# Patient Record
Sex: Female | Born: 1985 | Race: Black or African American | Hispanic: No | Marital: Single | Smoking: Current some day smoker
Health system: Southern US, Community
[De-identification: ages and names within clinical notes are randomized; demographics above are authoritative.]

## PROBLEM LIST (undated history)

## (undated) HISTORY — PX: SALPINGECTOMY: SHX328

---
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
## 2004-10-04 ENCOUNTER — Emergency Department (HOSPITAL_COMMUNITY): Admission: EM | Admit: 2004-10-04 | Discharge: 2004-10-04 | Payer: Self-pay | Admitting: *Deleted

## 2004-10-06 ENCOUNTER — Inpatient Hospital Stay (HOSPITAL_COMMUNITY): Admission: AD | Admit: 2004-10-06 | Discharge: 2004-10-06 | Payer: Self-pay | Admitting: Obstetrics and Gynecology

## 2004-10-09 ENCOUNTER — Inpatient Hospital Stay (HOSPITAL_COMMUNITY): Admission: AD | Admit: 2004-10-09 | Discharge: 2004-10-09 | Payer: Self-pay | Admitting: Obstetrics and Gynecology

## 2004-10-12 ENCOUNTER — Inpatient Hospital Stay (HOSPITAL_COMMUNITY): Admission: RE | Admit: 2004-10-12 | Discharge: 2004-10-12 | Payer: Self-pay | Admitting: Obstetrics and Gynecology

## 2004-10-15 ENCOUNTER — Inpatient Hospital Stay (HOSPITAL_COMMUNITY): Admission: AD | Admit: 2004-10-15 | Discharge: 2004-10-15 | Payer: Self-pay | Admitting: Obstetrics and Gynecology

## 2004-10-18 ENCOUNTER — Inpatient Hospital Stay (HOSPITAL_COMMUNITY): Admission: AD | Admit: 2004-10-18 | Discharge: 2004-10-18 | Payer: Self-pay | Admitting: Obstetrics and Gynecology

## 2004-10-25 ENCOUNTER — Inpatient Hospital Stay (HOSPITAL_COMMUNITY): Admission: AD | Admit: 2004-10-25 | Discharge: 2004-10-25 | Payer: Self-pay | Admitting: Obstetrics and Gynecology

## 2004-10-27 ENCOUNTER — Encounter (INDEPENDENT_AMBULATORY_CARE_PROVIDER_SITE_OTHER): Payer: Self-pay | Admitting: *Deleted

## 2004-10-27 ENCOUNTER — Ambulatory Visit: Admission: AD | Admit: 2004-10-27 | Discharge: 2004-10-27 | Payer: Self-pay | Admitting: Obstetrics and Gynecology

## 2004-10-29 ENCOUNTER — Inpatient Hospital Stay (HOSPITAL_COMMUNITY): Admission: AD | Admit: 2004-10-29 | Discharge: 2004-10-29 | Payer: Self-pay | Admitting: Obstetrics & Gynecology

## 2004-11-29 ENCOUNTER — Emergency Department (HOSPITAL_COMMUNITY): Admission: EM | Admit: 2004-11-29 | Discharge: 2004-11-30 | Payer: Self-pay | Admitting: Emergency Medicine

## 2005-06-06 ENCOUNTER — Emergency Department (HOSPITAL_COMMUNITY): Admission: EM | Admit: 2005-06-06 | Discharge: 2005-06-06 | Payer: Self-pay | Admitting: *Deleted

## 2005-06-08 ENCOUNTER — Emergency Department (HOSPITAL_COMMUNITY): Admission: EM | Admit: 2005-06-08 | Discharge: 2005-06-08 | Payer: Self-pay | Admitting: *Deleted

## 2005-06-14 ENCOUNTER — Emergency Department (HOSPITAL_COMMUNITY): Admission: EM | Admit: 2005-06-14 | Discharge: 2005-06-14 | Payer: Self-pay | Admitting: Emergency Medicine

## 2005-06-16 ENCOUNTER — Emergency Department (HOSPITAL_COMMUNITY): Admission: EM | Admit: 2005-06-16 | Discharge: 2005-06-16 | Payer: Self-pay | Admitting: Emergency Medicine

## 2006-01-10 ENCOUNTER — Inpatient Hospital Stay (HOSPITAL_COMMUNITY): Admission: AD | Admit: 2006-01-10 | Discharge: 2006-01-10 | Payer: Self-pay | Admitting: Gynecology

## 2006-08-14 IMAGING — US US TRANSVAGINAL NON-OB
1 series · 13 of 25 positions shown · non-contrast
Comparison: none

CLINICAL DATA: Early pregnancy.  Approximately eight weeks gestational age by LMP.  Pelvic pain.  Quantitative beta-HCG level of [DATE].  
 OBSTETRICAL ULTRASOUND WITH TRANSVAGINAL:
 There is no evidence of an intrauterine gestational sac.  No other fluid collections are seen within the endometrial cavity. There is no evidence of fibroids or other uterine abnormalities. 
 The left ovary contains a small rounded hypoechoic area measuring approximately 2 x 1.2 cm which has sonographic features of a hemorrhagic corpus luteum.  There is no evidence of free fluid in the left adnexa. 
 The right ovary is normal in appearance.  No right ovarian or adnexal masses are seen, however there is a small amount of free fluid seen in the right adnexa.

[Series 1: ob · 0.27mm/px · 13 of 36 slices shown]
[im 1/36]
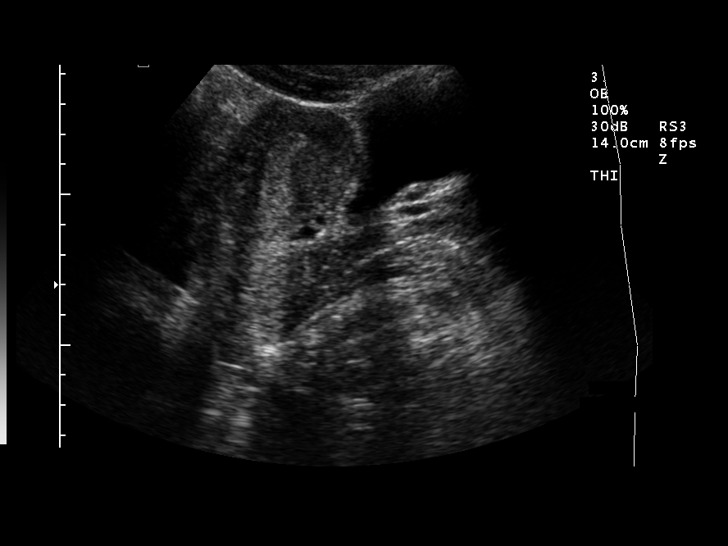
[im 3/36]
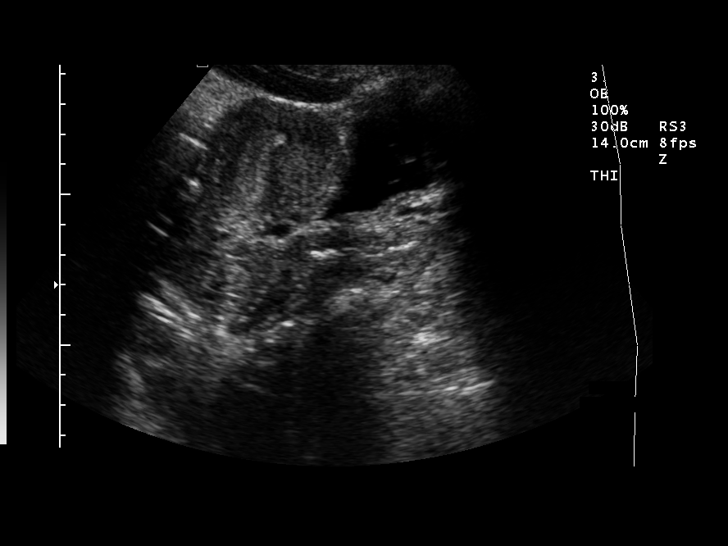
[im 6/36]
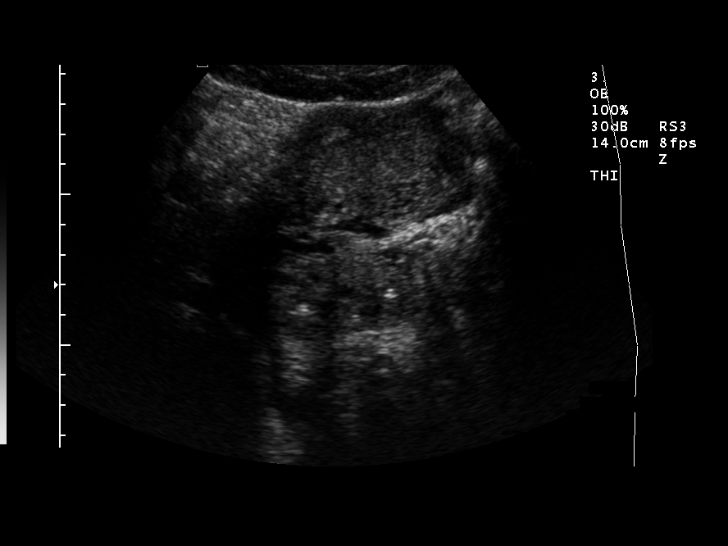
[im 9/36]
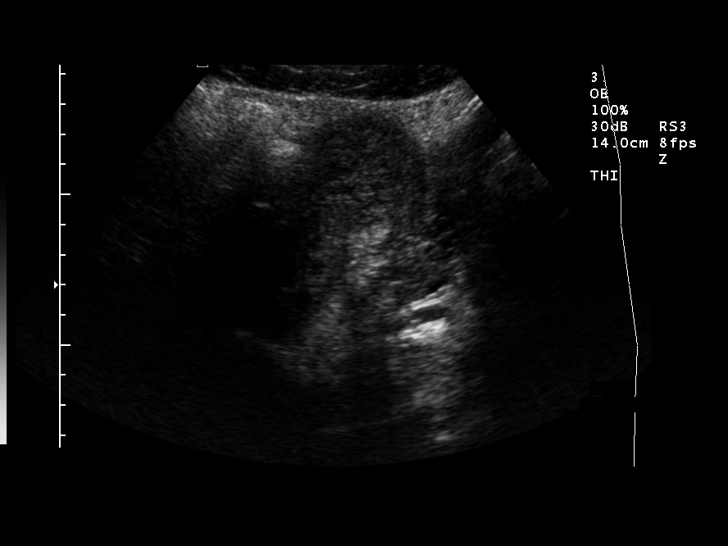
[im 12/36]
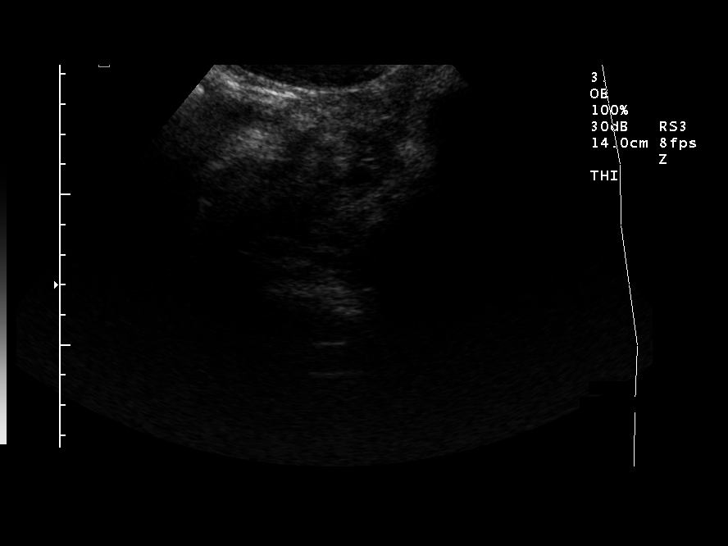
[im 15/36]
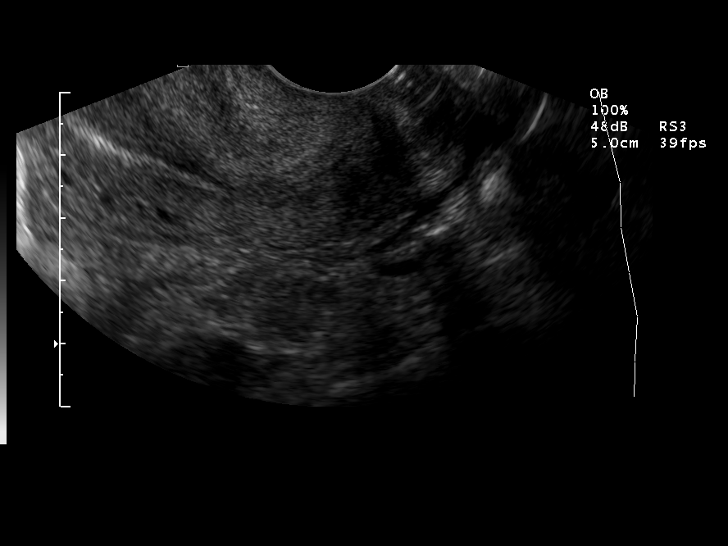
[im 18/36]
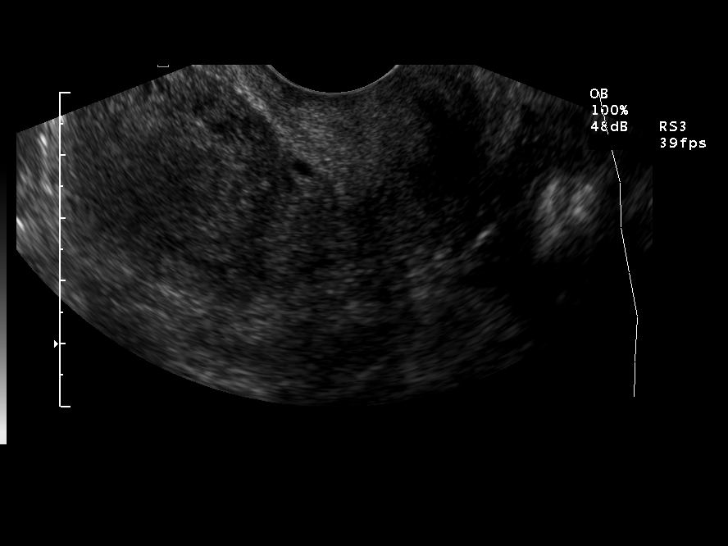
[im 21/36]
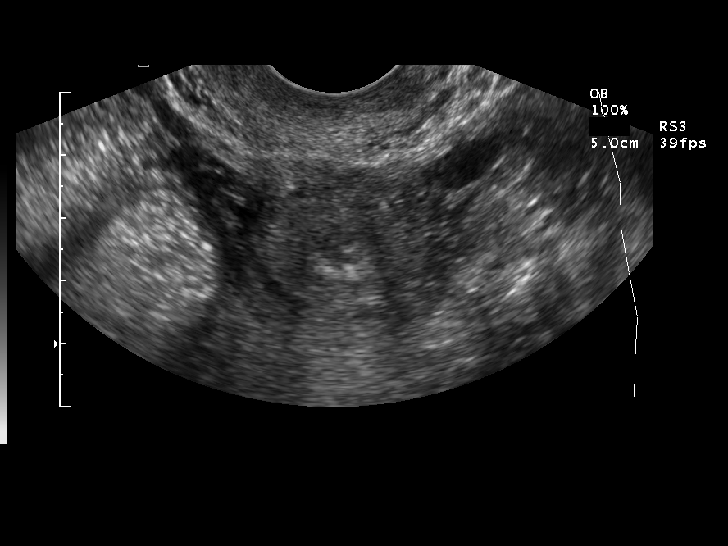
[im 24/36]
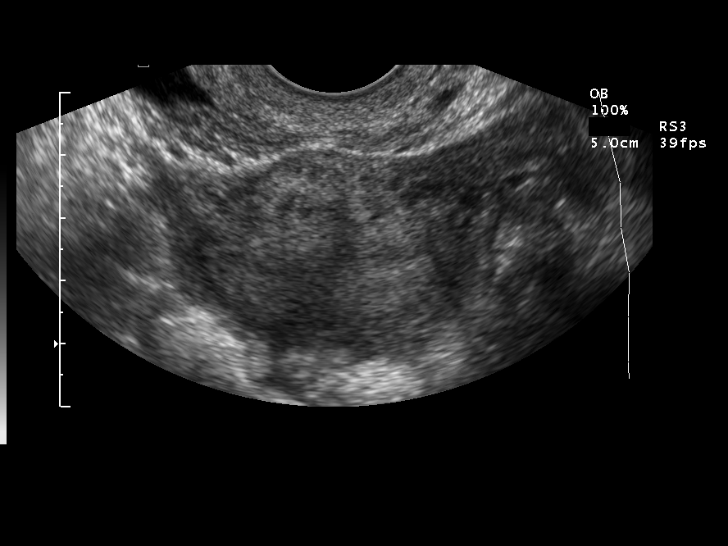
[im 27/36]
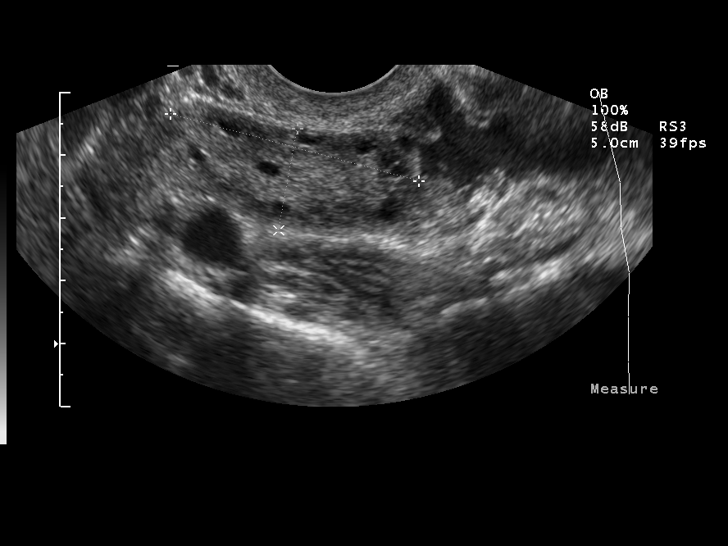
[im 30/36]
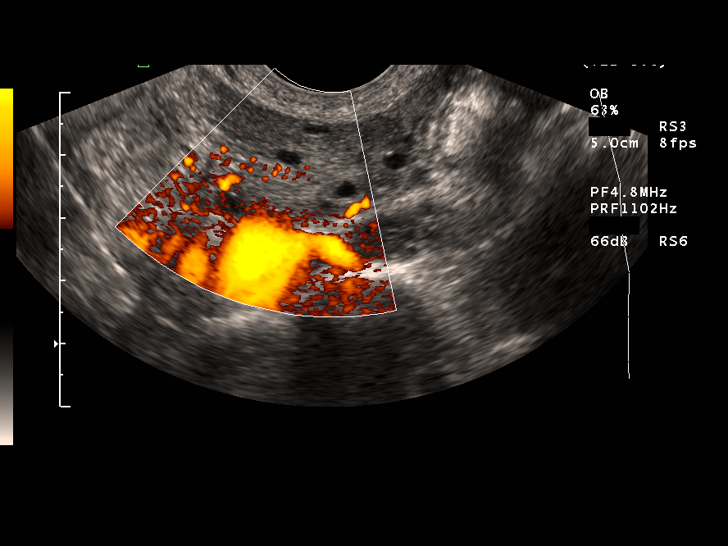
[im 33/36]
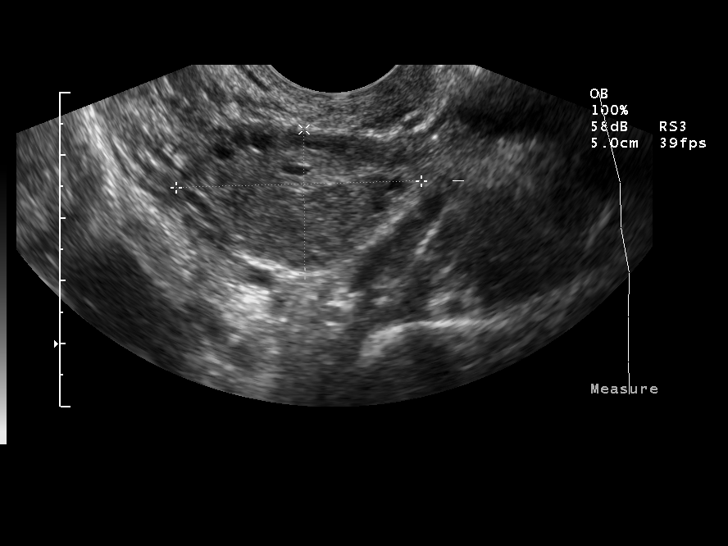
[im 36/36]
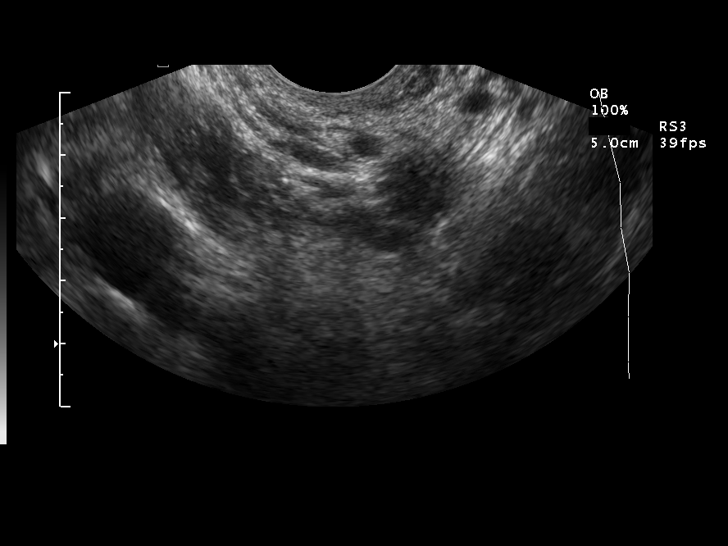

[13 of 25 positions shown; findings below may reference images not displayed]

IMPRESSION: No sonographic evidence of intrauterine gestational sac.  A small amount of free fluid is seen in the right adnexa, but no definite adnexal mass identified.  Although the beta-HCG level is greater than the typical discriminatory level to visualize an intrauterine gestational sac, an early IUP is still a possibility, however, an ectopic pregnancy cannot be excluded.  Close followup of quantitative beta-HCG levels is recommended as well as ultrasound followup.

## 2006-08-16 IMAGING — US US OB TRANSVAGINAL
1 series · 13 of 28 positions shown · non-contrast
Comparison: none

CLINICAL DATA: Abdominal pain with quantitative beta hCG of 6819.
TRANSVAGINAL OBSTETRICAL ULTRASOUND:
Correlation is made with the previous exam on 10/04/04 from [HOSPITAL].
Multiple images of the uterus and adnexa were obtained using an endovaginal approach.
There is a thickened endometrial canal identified measuring 1.0 cm.  No evidence for an intrauterine pregnancy is noted and should be clearly delineated with a quantitative beta hCG of 6819.  
Scanning in the left adnexa reveals a normal appearance to the left ovary which measures 3.2 x 2.2 x 2.0 cm.  Directly medial to the left ovary is an echogenic ring lesion measuring 1.6 x 1.1 x 1.3 cm.  This demonstrates peripheral flow and a 4 mm central lucency.  No evidence for a yolk sac is seen within this, but this would correlate with an approximate 4.5 week gestational sac, and a yolk sac would not necessarily be expected.  Given the lack of an intrauterine pregnancy this finding is highly suspicious for a left ectopic pregnancy.
The right ovary has a normal appearance measuring 1.8 x 4.1 x 1.7 cm.  There is some free fluid adjacent to the right ovary which as a proteinaceous component and ungulating septations within it suggesting the presence of adhesions.  No pelvic fluid is seen and a small amount of cul-de-sac fluid is noted.

[Series 1: us ob transvaginal · 0.19mm/px · 13 of 41 slices shown]
[im 2/41]
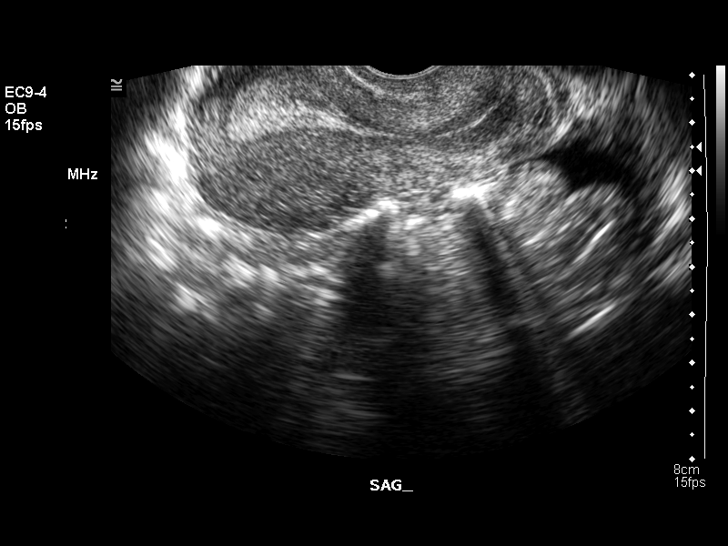
[im 5/41]
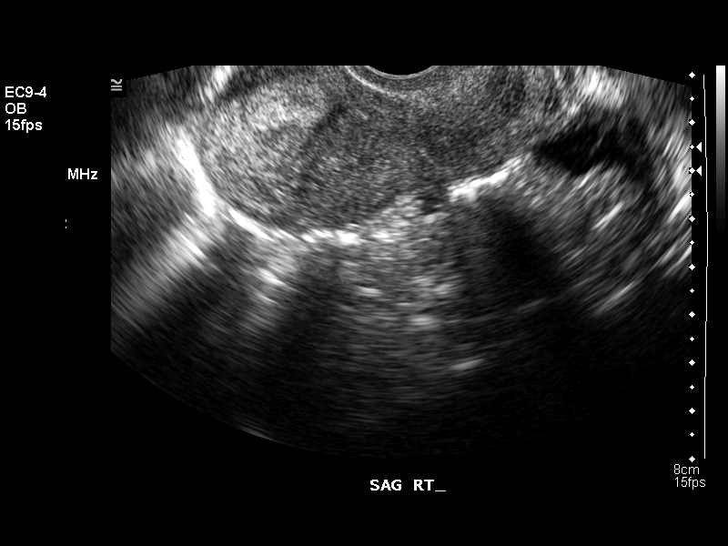
[im 8/41]
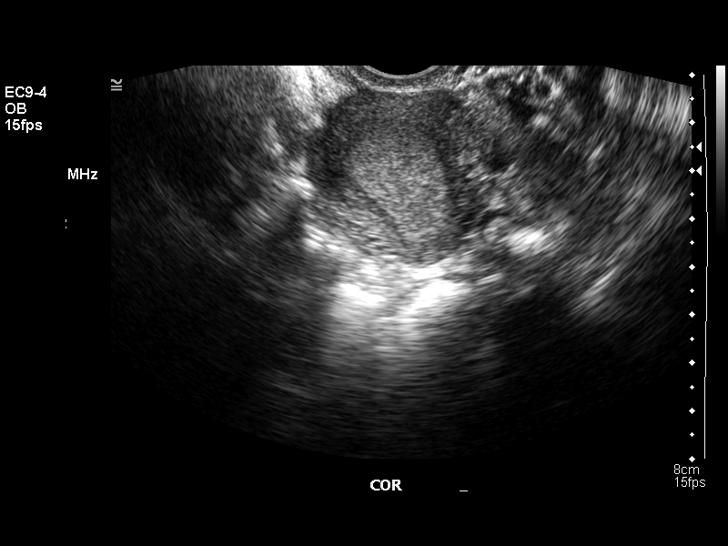
[im 11/41]
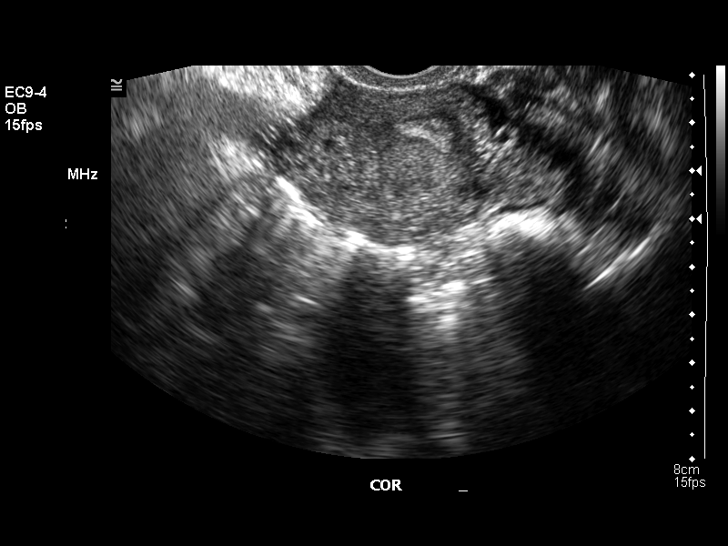
[im 14/41]
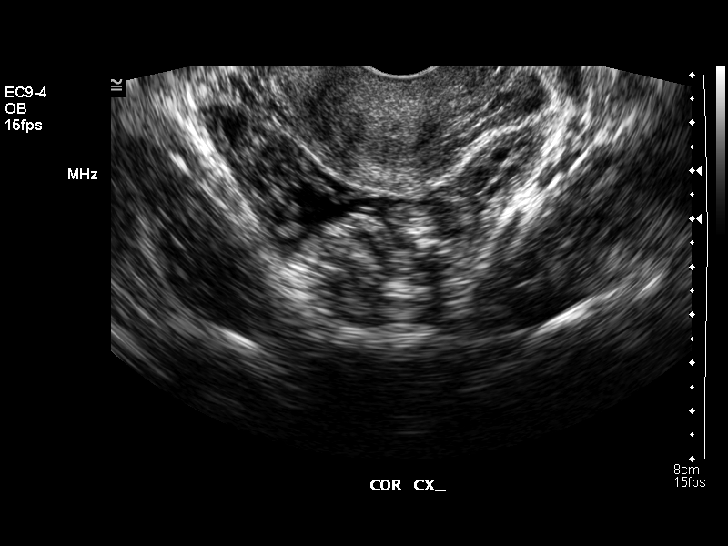
[im 17/41]
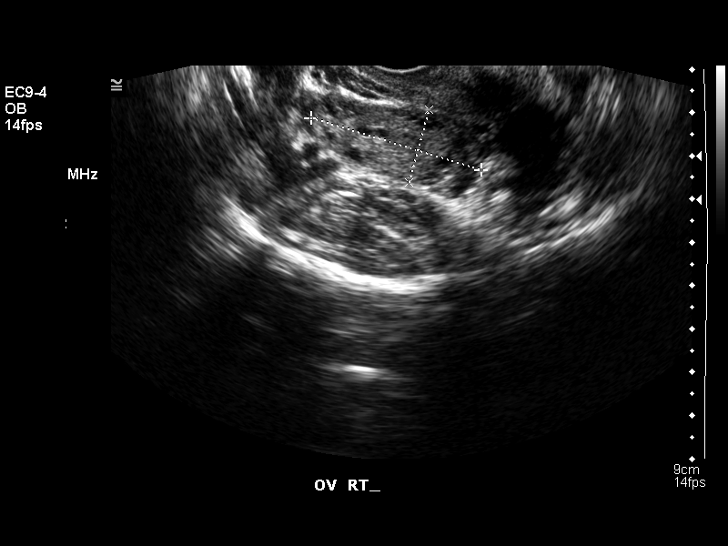
[im 21/41]
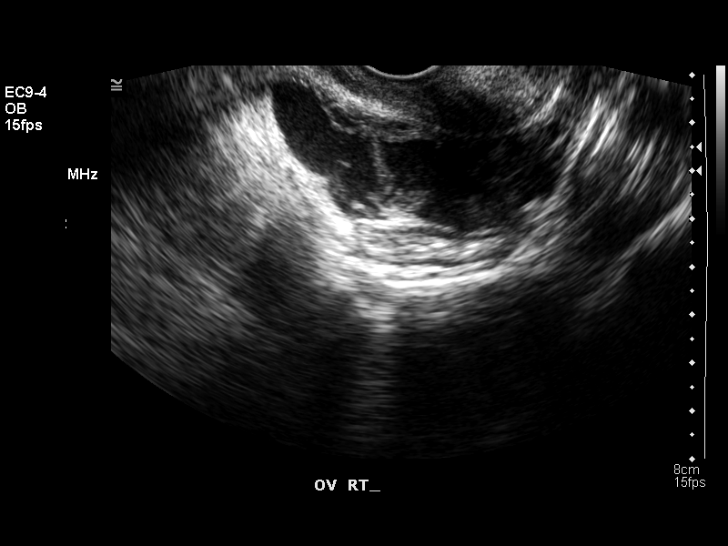
[im 24/41]
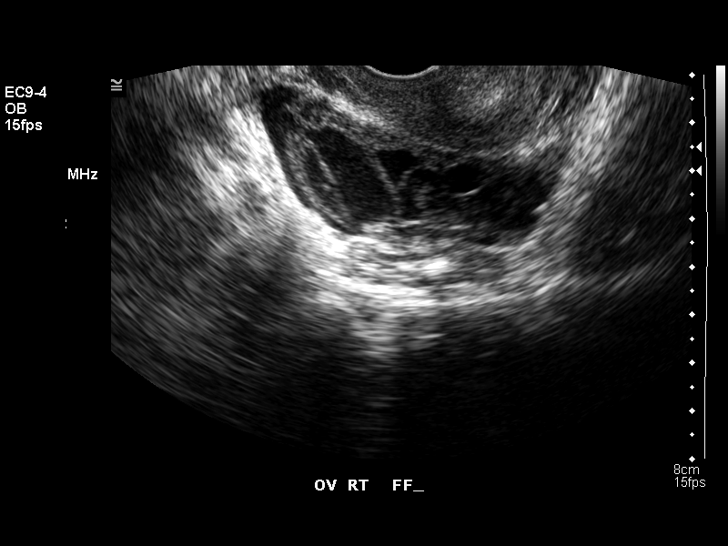
[im 27/41]
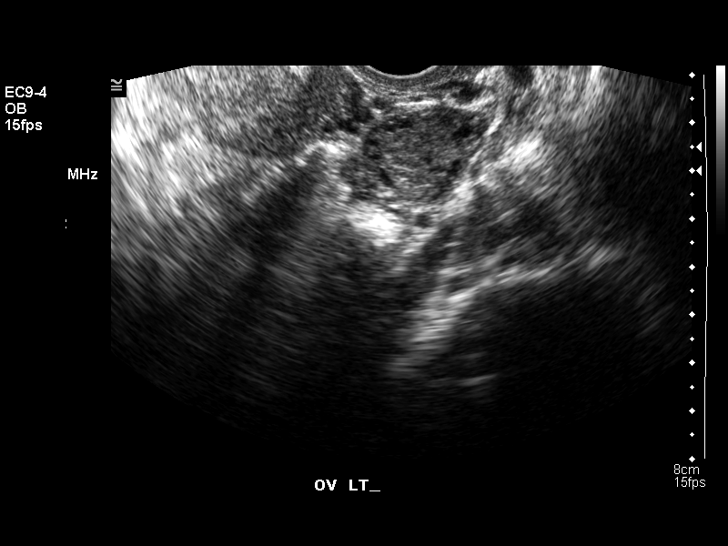
[im 30/41]
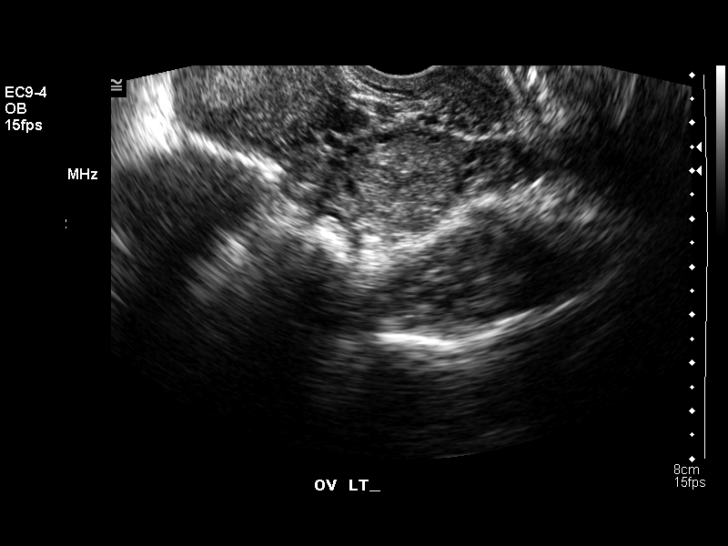
[im 33/41]
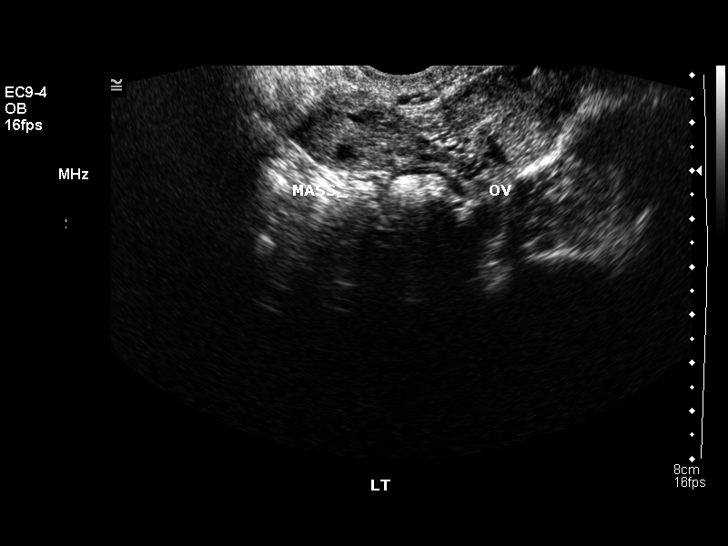
[im 36/41]
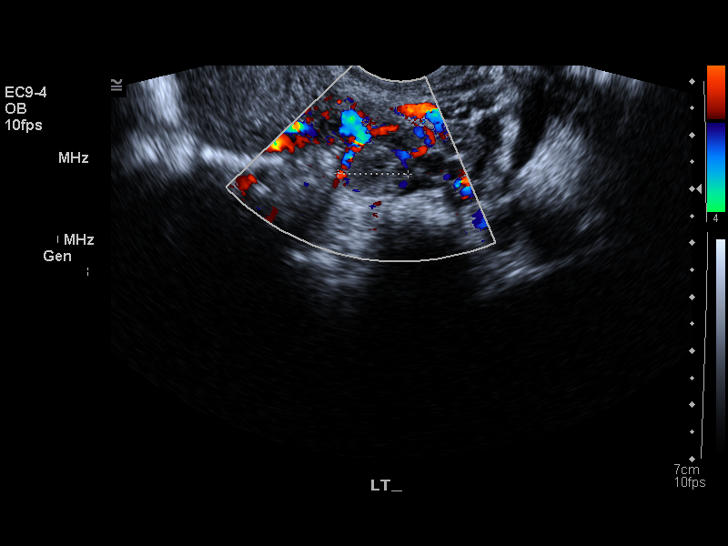
[im 39/41]
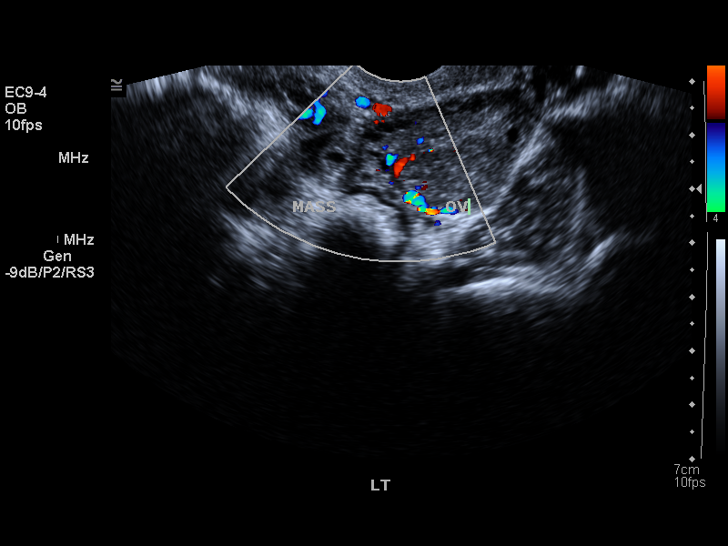

[13 of 28 positions shown; findings below may reference images not displayed]

IMPRESSION: Findings highly suspicious for a left ectopic pregnancy as described above.  Findings also suggestive of right paraovarian adhesions.

## 2009-01-10 ENCOUNTER — Emergency Department (HOSPITAL_COMMUNITY): Admission: EM | Admit: 2009-01-10 | Discharge: 2009-01-10 | Payer: Self-pay | Admitting: Emergency Medicine

## 2010-02-02 ENCOUNTER — Emergency Department (HOSPITAL_COMMUNITY): Admission: EM | Admit: 2010-02-02 | Discharge: 2010-02-02 | Payer: Self-pay | Admitting: Emergency Medicine

## 2010-02-04 ENCOUNTER — Ambulatory Visit: Payer: Self-pay | Admitting: Obstetrics and Gynecology

## 2010-02-04 ENCOUNTER — Inpatient Hospital Stay (HOSPITAL_COMMUNITY): Admission: AD | Admit: 2010-02-04 | Discharge: 2010-02-05 | Payer: Self-pay | Admitting: Obstetrics and Gynecology

## 2010-02-07 ENCOUNTER — Ambulatory Visit (HOSPITAL_COMMUNITY): Admission: RE | Admit: 2010-02-07 | Discharge: 2010-02-07 | Payer: Self-pay | Admitting: Obstetrics & Gynecology

## 2010-02-10 ENCOUNTER — Inpatient Hospital Stay (HOSPITAL_COMMUNITY): Admission: AD | Admit: 2010-02-10 | Discharge: 2010-02-10 | Payer: Self-pay | Admitting: Family Medicine

## 2010-02-13 ENCOUNTER — Ambulatory Visit (HOSPITAL_COMMUNITY): Admission: RE | Admit: 2010-02-13 | Discharge: 2010-02-13 | Payer: Self-pay | Admitting: Obstetrics & Gynecology

## 2010-02-16 ENCOUNTER — Ambulatory Visit: Payer: Self-pay | Admitting: Obstetrics and Gynecology

## 2010-02-16 ENCOUNTER — Ambulatory Visit (HOSPITAL_COMMUNITY): Admission: RE | Admit: 2010-02-16 | Discharge: 2010-02-16 | Payer: Self-pay | Admitting: Family Medicine

## 2010-02-17 ENCOUNTER — Inpatient Hospital Stay (HOSPITAL_COMMUNITY): Admission: AD | Admit: 2010-02-17 | Discharge: 2010-02-17 | Payer: Self-pay | Admitting: Family Medicine

## 2010-02-23 ENCOUNTER — Ambulatory Visit: Payer: Self-pay | Admitting: Obstetrics and Gynecology

## 2010-02-24 ENCOUNTER — Ambulatory Visit (HOSPITAL_COMMUNITY): Admission: RE | Admit: 2010-02-24 | Discharge: 2010-02-24 | Payer: Self-pay | Admitting: Family Medicine

## 2010-02-24 ENCOUNTER — Ambulatory Visit: Payer: Self-pay | Admitting: Obstetrics & Gynecology

## 2010-02-25 ENCOUNTER — Encounter: Payer: Self-pay | Admitting: Obstetrics & Gynecology

## 2010-03-03 ENCOUNTER — Ambulatory Visit: Payer: Self-pay | Admitting: Family Medicine

## 2010-03-03 ENCOUNTER — Encounter (INDEPENDENT_AMBULATORY_CARE_PROVIDER_SITE_OTHER): Payer: Self-pay | Admitting: *Deleted

## 2010-03-10 ENCOUNTER — Encounter: Payer: Self-pay | Admitting: Obstetrics and Gynecology

## 2010-03-10 ENCOUNTER — Ambulatory Visit: Payer: Self-pay | Admitting: Obstetrics & Gynecology

## 2010-04-21 ENCOUNTER — Encounter (INDEPENDENT_AMBULATORY_CARE_PROVIDER_SITE_OTHER): Payer: Self-pay | Admitting: Family Medicine

## 2010-04-21 ENCOUNTER — Ambulatory Visit: Payer: Self-pay | Admitting: Obstetrics and Gynecology

## 2010-04-21 LAB — CONVERTED CEMR LAB: hCG, Beta Chain, Quant, S: <2 milliintl units/mL

## 2010-04-22 ENCOUNTER — Encounter (INDEPENDENT_AMBULATORY_CARE_PROVIDER_SITE_OTHER): Payer: Self-pay | Admitting: Family Medicine

## 2010-04-22 LAB — CONVERTED CEMR LAB
Clue Cells Wet Prep HPF POC: NONE SEEN
Yeast Wet Prep HPF POC: NONE SEEN

## 2010-05-04 ENCOUNTER — Ambulatory Visit: Payer: Self-pay | Admitting: Obstetrics and Gynecology

## 2010-05-04 ENCOUNTER — Inpatient Hospital Stay (HOSPITAL_COMMUNITY): Admission: AD | Admit: 2010-05-04 | Discharge: 2010-05-04 | Payer: Self-pay | Admitting: Obstetrics and Gynecology

## 2010-05-09 ENCOUNTER — Ambulatory Visit: Payer: Self-pay | Admitting: Obstetrics & Gynecology

## 2010-07-29 ENCOUNTER — Ambulatory Visit: Payer: Self-pay | Admitting: Obstetrics & Gynecology

## 2010-10-14 ENCOUNTER — Ambulatory Visit: Admit: 2010-10-14 | Payer: Self-pay | Admitting: Obstetrics & Gynecology

## 2010-10-16 ENCOUNTER — Encounter: Payer: Self-pay | Admitting: *Deleted

## 2010-12-09 LAB — URINALYSIS, ROUTINE W REFLEX MICROSCOPIC
Glucose, UA: NEGATIVE mg/dL
Ketones, ur: 15 mg/dL — AB
Protein, ur: NEGATIVE mg/dL

## 2010-12-09 LAB — CBC
MCH: 33 pg (ref 26.0–34.0)
MCHC: 34.6 g/dL (ref 30.0–36.0)
MCV: 95.6 fL (ref 78.0–100.0)
Platelets: 186 10*3/uL (ref 150–400)
RDW: 12.8 % (ref 11.5–15.5)
WBC: 8.1 10*3/uL (ref 4.0–10.5)

## 2010-12-09 LAB — URINE MICROSCOPIC-ADD ON

## 2010-12-09 LAB — URINE CULTURE: Culture  Setup Time: 201108110058

## 2010-12-09 LAB — WET PREP, GENITAL
Trich, Wet Prep: NONE SEEN
Yeast Wet Prep HPF POC: NONE SEEN

## 2010-12-09 LAB — POCT PREGNANCY, URINE: Preg Test, Ur: NEGATIVE

## 2010-12-09 LAB — GC/CHLAMYDIA PROBE AMP, GENITAL: Chlamydia, DNA Probe: NEGATIVE

## 2010-12-12 LAB — DIFFERENTIAL
Basophils Relative: 0 % (ref 0–1)
Eosinophils Absolute: 0 10*3/uL (ref 0.0–0.7)
Monocytes Absolute: 0 10*3/uL — ABNORMAL LOW (ref 0.1–1.0)
Monocytes Relative: 0 % — ABNORMAL LOW (ref 3–12)

## 2010-12-12 LAB — CBC
HCT: 37.9 % (ref 36.0–46.0)
Hemoglobin: 12.1 g/dL (ref 12.0–15.0)
Hemoglobin: 13.3 g/dL (ref 12.0–15.0)
MCHC: 34.8 g/dL (ref 30.0–36.0)
MCHC: 35 g/dL (ref 30.0–36.0)
MCV: 96.6 fL (ref 78.0–100.0)
MCV: 96.8 fL (ref 78.0–100.0)
RBC: 3.59 MIL/uL — ABNORMAL LOW (ref 3.87–5.11)
RDW: 13.9 % (ref 11.5–15.5)

## 2010-12-13 LAB — DIFFERENTIAL
Basophils Absolute: 0.1 10*3/uL (ref 0.0–0.1)
Basophils Relative: 0 % (ref 0–1)
Eosinophils Absolute: 0 10*3/uL (ref 0.0–0.7)
Eosinophils Absolute: 0.1 10*3/uL (ref 0.0–0.7)
Eosinophils Relative: 0 % (ref 0–5)
Eosinophils Relative: 1 % (ref 0–5)
Monocytes Absolute: 0.6 10*3/uL (ref 0.1–1.0)
Monocytes Absolute: 0.7 10*3/uL (ref 0.1–1.0)
Monocytes Relative: 5 % (ref 3–12)
Neutrophils Relative %: 74 % (ref 43–77)

## 2010-12-13 LAB — URINE MICROSCOPIC-ADD ON

## 2010-12-13 LAB — CBC
HCT: 40 % (ref 36.0–46.0)
MCHC: 33.6 g/dL (ref 30.0–36.0)
MCHC: 34.4 g/dL (ref 30.0–36.0)
MCV: 95.8 fL (ref 78.0–100.0)
Platelets: 195 10*3/uL (ref 150–400)
RBC: 4.17 MIL/uL (ref 3.87–5.11)
RDW: 13.8 % (ref 11.5–15.5)

## 2010-12-13 LAB — URINALYSIS, ROUTINE W REFLEX MICROSCOPIC
Glucose, UA: NEGATIVE mg/dL
Hgb urine dipstick: NEGATIVE
Ketones, ur: >80 mg/dL — AB
Protein, ur: 30 mg/dL — AB

## 2010-12-13 LAB — WET PREP, GENITAL: Yeast Wet Prep HPF POC: NONE SEEN

## 2010-12-13 LAB — ABO/RH: ABO/RH(D): O POS

## 2010-12-13 LAB — HCG, QUANTITATIVE, PREGNANCY
hCG, Beta Chain, Quant, S: 2006 m[IU]/mL — ABNORMAL HIGH (ref <5)
hCG, Beta Chain, Quant, S: 2542 m[IU]/mL — ABNORMAL HIGH (ref <5)

## 2010-12-13 LAB — BUN: BUN: 8 mg/dL (ref 6–23)

## 2010-12-13 LAB — CREATININE, SERUM
Creatinine, Ser: 0.64 mg/dL (ref 0.4–1.2)
GFR calc non Af Amer: >60 mL/min (ref >60)

## 2010-12-13 LAB — POCT I-STAT, CHEM 8
BUN: 9 mg/dL (ref 6–23)
Chloride: 105 mEq/L (ref 96–112)
HCT: 43 % (ref 36.0–46.0)
Potassium: 3.5 mEq/L (ref 3.5–5.1)
Sodium: 138 mEq/L (ref 135–145)

## 2010-12-13 LAB — GC/CHLAMYDIA PROBE AMP, GENITAL
Chlamydia, DNA Probe: NEGATIVE
GC Probe Amp, Genital: NEGATIVE

## 2010-12-13 LAB — PROTIME-INR: INR: 1.1 (ref 0.00–1.49)

## 2010-12-13 LAB — AST: AST: 24 U/L (ref 0–37)

## 2010-12-23 ENCOUNTER — Inpatient Hospital Stay (HOSPITAL_COMMUNITY)
Admission: AD | Admit: 2010-12-23 | Discharge: 2010-12-23 | Disposition: A | Discharge status: Home / Self Care | Payer: BC Managed Care – PPO | Source: Ambulatory Visit | Attending: Obstetrics & Gynecology | Admitting: Obstetrics & Gynecology

## 2010-12-23 DIAGNOSIS — N949 Unspecified condition associated with female genital organs and menstrual cycle: Secondary | ICD-10-CM | POA: Insufficient documentation

## 2010-12-23 DIAGNOSIS — N938 Other specified abnormal uterine and vaginal bleeding: Secondary | ICD-10-CM | POA: Insufficient documentation

## 2010-12-23 LAB — URINALYSIS, ROUTINE W REFLEX MICROSCOPIC
Bilirubin Urine: NEGATIVE
Glucose, UA: NEGATIVE mg/dL
Protein, ur: NEGATIVE mg/dL
Urobilinogen, UA: 0.2 mg/dL (ref 0.0–1.0)

## 2010-12-23 LAB — URINE MICROSCOPIC-ADD ON

## 2010-12-23 LAB — CBC
HCT: 38.5 % (ref 36.0–46.0)
Hemoglobin: 13.4 g/dL (ref 12.0–15.0)
MCHC: 34.8 g/dL (ref 30.0–36.0)
MCV: 90 fL (ref 78.0–100.0)
RDW: 13.2 % (ref 11.5–15.5)

## 2010-12-23 LAB — WET PREP, GENITAL
Clue Cells Wet Prep HPF POC: NONE SEEN
Trich, Wet Prep: NONE SEEN

## 2011-01-04 LAB — RAPID STREP SCREEN (MED CTR MEBANE ONLY): Streptococcus, Group A Screen (Direct): NEGATIVE

## 2011-02-10 NOTE — Op Note (Signed)
Cathy Sweeney, Cathy Sweeney              ACCOUNT NO.:  [id number]   MEDICAL RECORD NO.:  [id number]          PATIENT TYPE:  AMB   LOCATION:  [facility]                          FACILITY:  [facility]   PHYSICIAN:  Richardean Sale, M.D.   DATE OF BIRTH:  06-21-1986   DATE OF PROCEDURE:  10/27/2004  DATE OF DISCHARGE:                                 OPERATIVE REPORT   PREOPERATIVE DIAGNOSIS:  Persistent ectopic, status post methotrexate x2.   POSTOPERATIVE DIAGNOSIS:  1.  Persistent ectopic, status post methotrexate x2.  2.  Ruptured ectopic.  3.  Extensive PID with Fitz-Hugh-Curtis syndrome.   SURGEON:  Richardean Sale, M.D.   ASSISTANT:  [name], M.D.   PROCEDURE:  Laparotomy with attempted left salpingostomy converted to left  complete salpingectomy for hemorrhagic ruptured ectopic and lysis of  adhesions.   ANESTHESIA:  General.   COMPLICATIONS:  None.   ESTIMATED BLOOD LOSS:  300 mL.   FINDINGS:  Approximately 200 mL of blood in the pelvis coming from the end  of the left fallopian tube and a rent in the end of the left fallopian tube.  Left ectopic pregnancy with the left tube markedly dilated along its entire  length.  Extensive adhesions involving the pelvis, both tubes and ovaries,  and the liver consistent with Fitz-Hugh-Curtis syndrome.  Normal appearing  uterus.   SPECIMENS:  Left fallopian tube and ectopic sent to pathology.   INDICATIONS FOR PROCEDURE:  This is a 25 year old gravida 4, para 0-0-3-0,  black female who was diagnosed with an ectopic pregnancy based on  inappropriately rising quantitative hCG's and an ectopic visualized on  ultrasound.  The patient received her second injection of methotrexate on  October 12, 2004, when her quantitative hCG was 6926.  Quantitative hCG then  dropped on January 24, to 565, but on January 31 it increased to 2062.  The  patient was instructed to come to the hospital, but did not seek further  medical care until the day of  this procedure.  The patient was counseled on  the need to pursue surgical management given her failure to respond to two  methotrexate injections and her rising quantitative hCG's.  The patient at  the time of her procedure was complaining of left lower quadrant pain,  denied any syncope, or heavy vaginal bleeding.  Prior to the procedure, the  risks and benefits were reviewed with the patient in detail.  She was  counseled on the risks of hemorrhage requiring transfusion, infection,  possible injury to the bowel or bladder which would require additional  surgery to repair, the possibility of having to remove the fallopian tube  and/or the ovary which could possibly impair her future fertility if the  other fallopian tube and ovary were not normal.  The patient voiced  understanding of all of these above risks and the risks of general  anesthesia and informed consent was obtained.  All questions were then  answered.   DESCRIPTION OF PROCEDURE:  The patient was taken to the operating room where  she was given a general anesthetic.  She was then  prepped and draped in the  usual sterile fashion and placed in the dorsal lithotomy position.  A Foley  catheter was then placed.  A speculum was placed in the vagina.  The cervix  was grasped with a single tooth tenaculum and the Acorn cannula was then  introduced into the cervix to facilitate uterine manipulation during the  procedure.  The speculum was then removed.  Attention was then turned to the  patient's abdomen where 5 mL of 0.5% plain Marcaine were injected in the  infraumbilical area.  10 mm skin incision was then made just below the  umbilicus.  This was carried down sharply to the fascia.  The fascia was  tented up and entered sharply with scissors.  The fascial incision was then  extended and the fascial incision was secured with a pursestring Vicryl  suture.  The peritoneum was then identified, grasped between two Hemostats  and  entered sharply.  The Hasson trocar and sleeve were then introduced and  the camera was then introduced through the Hasson to confirm intra-abdominal  placement.  CO2 gas was then allowed to insufflate.  There was no evidence  of any injury to the bowel upon entry into the abdomen.  At this point,  inspection of the pelvis revealed a small amount of blood in the pelvis  therefore a second incision was then made in the patient's right lower  quadrant.  2 mL of 0.5% Marcaine were injected prior to making the incision.  The incision was made in an area that was free of the epigastric vessels and  the 5 mm trocar and sleeve were then introduced under direct visualization.  A second port was then placed in the left lower quadrant in a similar  fashion again taking great care to avoid the inferior epigastric vessels.  Once these ports were in place, the pelvis was then inspected with the  findings noted above.  The left fallopian tube was markedly swollen and  dilated throughout its length.  The fimbria were difficult to visualize as  the tube was very adherent to the left ovary.  There was bleeding coming  from the end of the fallopian tube and appeared to be a small rent near the  end.  There were extensive adhesions involving the fallopian tube to both  left ovary and the pelvic side wall.  These were taken down with sharp  dissection.  Once the fallopian tube was mobilized, an attempt was made to  perform a salpingectomy.  A linear incision was then made along the length  of the tube and the tubal contents were attempted to be removed.  The  products of conception were very adherent to the lumen of the fallopian  tube.  There was a significant amount of brisk bright red bleeding that  followed.  The fallopian tube was inspected.  The fimbriated end appeared  clubbed and given the bleeding from the salpingostomy incision and the clubbed fimbriated end and the fact that this ectopic extended  along the  entire length of the tube, decision was then made to convert to a  salpingectomy as the fallopian tube could not be salvaged and was too  significantly damaged from the ectopic.  Therefore, a salpingectomy was  performed using the tripolar cautery.  Further dissection needed to be  performed to adequately free the fallopian tube from the ovary and the  pelvic side wall once this was complete.  The tripolar cautery was used to  cauterize  and transect the fallopian tube along the mesosalpinx and then  from its origin on the uterus.  Once the fallopian tube was removed, it was  placed in the anterior cul-de-sac.  The surgical site was inspected and was  hemostatic.  Additional adhesiolysis was then performed on the right side.  The right fallopian tube was carefully inspected.  The fimbriated end  appeared normal and the fallopian tube did not obviously appear occluded.  Once the adhesiolysis was complete, the 5 mm camera was then connected and  inserted through one of the 5 mm ports and the 10 mm EndoCatch was then  placed through the 10 mm port and the fallopian tube was placed into the  EndoCatch and the remaining products of conception were placed in the  EndoCatch as well.  The bag was then closed and removed through the 10 mm  incision.  The Hasson was replaced.  We converted back to the 10 mm camera.  Inspection of the pelvis revealed that the left salpingectomy site was  hemostatic.  There was some very superficial bleeding coming from some of  the adhesions that had been lysed.  These were cauterized and were  hemostatic.  At this point, the pelvis was irrigated copiously with warm  normal saline.  Reinspection confirmed excellent hemostasis.  At this point,  the procedure was terminated.  The 5 mm port and all instruments were  removed from the patient's abdomen under direct visualization.  CO2 gas was  allowed to escape.  There was no evidence of bleeding coming from  the  surgical site.  Once the CO2 gas was removed, the 10 mm trocar port was  removed and a finger was placed into the fascial incision and a pursestring  suture was then tied and there was no evidence of any bowel or omentum  looped up into the incision.  The skin incisions were then closed with 4-0  Vicryl in a subcuticular fashion.  Attention was then turned to the  patient's vagina where the speculum, the single tooth tenaculum, and the  Acorn cannula were removed.  There was minimal bleeding coming from the  cervix.   The patient tolerated the procedure very well.  All needle, sponge, and  instrument counts correct x2.  She was taken to the recovery room awake and  in stable condition.      JW/MEDQ  D:  10/27/2004  T:  10/27/2004  Job:  [id number]   cc:   [name], M.D.  [address]  [place]  [place] [address]  Fax: [phone]

## 2011-08-28 ENCOUNTER — Encounter (HOSPITAL_COMMUNITY): Payer: Self-pay | Admitting: Anesthesiology

## 2011-08-28 ENCOUNTER — Inpatient Hospital Stay (HOSPITAL_COMMUNITY): Payer: BC Managed Care – PPO | Admitting: Anesthesiology

## 2011-08-28 ENCOUNTER — Encounter (HOSPITAL_COMMUNITY): Payer: Self-pay | Admitting: *Deleted

## 2011-08-28 ENCOUNTER — Encounter (HOSPITAL_COMMUNITY)
Admission: AD | Disposition: A | Discharge status: Home / Self Care | Payer: Self-pay | Source: Ambulatory Visit | Attending: Obstetrics & Gynecology

## 2011-08-28 ENCOUNTER — Ambulatory Visit (HOSPITAL_COMMUNITY)
Admission: AD | Admit: 2011-08-28 | Discharge: 2011-08-28 | Disposition: A | Discharge status: Home / Self Care | Payer: BC Managed Care – PPO | Source: Ambulatory Visit | Attending: Obstetrics & Gynecology | Admitting: Obstetrics & Gynecology

## 2011-08-28 ENCOUNTER — Other Ambulatory Visit: Payer: Self-pay | Admitting: Obstetrics & Gynecology

## 2011-08-28 ENCOUNTER — Inpatient Hospital Stay (HOSPITAL_COMMUNITY): Payer: BC Managed Care – PPO

## 2011-08-28 DIAGNOSIS — O00109 Unspecified tubal pregnancy without intrauterine pregnancy: Secondary | ICD-10-CM

## 2011-08-28 DIAGNOSIS — O009 Unspecified ectopic pregnancy without intrauterine pregnancy: Secondary | ICD-10-CM

## 2011-08-28 HISTORY — PX: LAPAROSCOPY: SHX197

## 2011-08-28 LAB — URINE MICROSCOPIC-ADD ON

## 2011-08-28 LAB — CBC
HCT: 35.1 % — ABNORMAL LOW (ref 36.0–46.0)
Hemoglobin: 12.6 g/dL (ref 12.0–15.0)
MCH: 32.2 pg (ref 26.0–34.0)
MCV: 89.8 fL (ref 78.0–100.0)
RBC: 3.91 MIL/uL (ref 3.87–5.11)
WBC: 9.2 10*3/uL (ref 4.0–10.5)

## 2011-08-28 LAB — WET PREP, GENITAL
Trich, Wet Prep: NONE SEEN
Yeast Wet Prep HPF POC: NONE SEEN

## 2011-08-28 LAB — URINALYSIS, ROUTINE W REFLEX MICROSCOPIC
Bilirubin Urine: NEGATIVE
Ketones, ur: NEGATIVE mg/dL
Specific Gravity, Urine: 1.025 (ref 1.005–1.030)
pH: 6 (ref 5.0–8.0)

## 2011-08-28 LAB — HCG, QUANTITATIVE, PREGNANCY: hCG, Beta Chain, Quant, S: 6403 m[IU]/mL — ABNORMAL HIGH (ref <5)

## 2011-08-28 LAB — TYPE AND SCREEN

## 2011-08-28 LAB — ABO/RH: ABO/RH(D): O POS

## 2011-08-28 SURGERY — LAPAROSCOPY OPERATIVE
Anesthesia: Choice | Site: Abdomen | Laterality: Right | Wound class: Clean

## 2011-08-28 MED ORDER — DEXTROSE 5 % IV SOLN
2.0000 g | INTRAVENOUS | Status: AC
  Administered 2011-08-28: 2 g via INTRAVENOUS
  Filled 2011-08-28: qty 2

## 2011-08-28 MED ORDER — NEOSTIGMINE METHYLSULFATE 1 MG/ML IJ SOLN
INTRAMUSCULAR | Status: DC | PRN
  Administered 2011-08-28: 3 mg via INTRAVENOUS

## 2011-08-28 MED ORDER — FENTANYL CITRATE 0.05 MG/ML IJ SOLN
INTRAMUSCULAR | Status: AC
  Administered 2011-08-28: 50 ug via INTRAVENOUS
  Filled 2011-08-28: qty 2

## 2011-08-28 MED ORDER — KETOROLAC TROMETHAMINE 30 MG/ML IJ SOLN
INTRAMUSCULAR | Status: DC | PRN
  Administered 2011-08-28: 30 mg via INTRAVENOUS

## 2011-08-28 MED ORDER — GLYCOPYRROLATE 0.2 MG/ML IJ SOLN
INTRAMUSCULAR | Status: DC | PRN
  Administered 2011-08-28: .4 mg via INTRAVENOUS

## 2011-08-28 MED ORDER — OXYCODONE-ACETAMINOPHEN 5-325 MG PO TABS: 1.0000 | ORAL_TABLET | ORAL | Status: AC | PRN

## 2011-08-28 MED ORDER — CITRIC ACID-SODIUM CITRATE 334-500 MG/5ML PO SOLN
30.0000 mL | Freq: Once | ORAL | Status: AC
  Administered 2011-08-28: 30 mL via ORAL
  Filled 2011-08-28: qty 15

## 2011-08-28 MED ORDER — LIDOCAINE HCL (CARDIAC) 20 MG/ML IV SOLN
INTRAVENOUS | Status: DC | PRN
  Administered 2011-08-28: 60 mg via INTRAVENOUS

## 2011-08-28 MED ORDER — SODIUM CHLORIDE 0.9 % IJ SOLN
INTRAMUSCULAR | Status: DC | PRN
  Administered 2011-08-28: 10 mL via INTRAVENOUS

## 2011-08-28 MED ORDER — OXYCODONE-ACETAMINOPHEN 5-325 MG PO TABS
ORAL_TABLET | ORAL | Status: AC
  Filled 2011-08-28: qty 1

## 2011-08-28 MED ORDER — MIDAZOLAM HCL 5 MG/5ML IJ SOLN
INTRAMUSCULAR | Status: DC | PRN
  Administered 2011-08-28: 2 mg via INTRAVENOUS

## 2011-08-28 MED ORDER — IBUPROFEN 600 MG PO TABS: 600.0000 mg | ORAL_TABLET | Freq: Four times a day (QID) | ORAL | Status: AC | PRN

## 2011-08-28 MED ORDER — FENTANYL CITRATE 0.05 MG/ML IJ SOLN
INTRAMUSCULAR | Status: DC | PRN
  Administered 2011-08-28: 50 ug via INTRAVENOUS
  Administered 2011-08-28 (×2): 100 ug via INTRAVENOUS

## 2011-08-28 MED ORDER — FAMOTIDINE IN NACL 20-0.9 MG/50ML-% IV SOLN
20.0000 mg | Freq: Once | INTRAVENOUS | Status: AC
  Administered 2011-08-28: 20 mg via INTRAVENOUS
  Filled 2011-08-28: qty 50

## 2011-08-28 MED ORDER — FENTANYL CITRATE 0.05 MG/ML IJ SOLN
25.0000 ug | INTRAMUSCULAR | Status: DC | PRN
  Administered 2011-08-28: 50 ug via INTRAVENOUS
  Administered 2011-08-28: 100 ug via INTRAVENOUS

## 2011-08-28 MED ORDER — ONDANSETRON HCL 4 MG/2ML IJ SOLN
INTRAMUSCULAR | Status: DC | PRN
  Administered 2011-08-28: 4 mg via INTRAVENOUS

## 2011-08-28 MED ORDER — ROCURONIUM BROMIDE 100 MG/10ML IV SOLN
INTRAVENOUS | Status: DC | PRN
  Administered 2011-08-28: 35 mg via INTRAVENOUS

## 2011-08-28 MED ORDER — DEXAMETHASONE SODIUM PHOSPHATE 10 MG/ML IJ SOLN
INTRAMUSCULAR | Status: DC | PRN
  Administered 2011-08-28: 10 mg via INTRAVENOUS

## 2011-08-28 MED ORDER — BUPIVACAINE HCL (PF) 0.25 % IJ SOLN
INTRAMUSCULAR | Status: DC | PRN
  Administered 2011-08-28: 10 mL

## 2011-08-28 MED ORDER — LACTATED RINGERS IR SOLN
Status: DC | PRN
  Administered 2011-08-28: 3000 mL

## 2011-08-28 MED ORDER — LACTATED RINGERS IV SOLN
INTRAVENOUS | Status: DC
  Administered 2011-08-28 (×2): via INTRAVENOUS

## 2011-08-28 MED ORDER — OXYCODONE-ACETAMINOPHEN 5-325 MG PO TABS
1.0000 | ORAL_TABLET | ORAL | Status: DC | PRN
  Administered 2011-08-28: 1 via ORAL

## 2011-08-28 MED ORDER — PROPOFOL 10 MG/ML IV EMUL
INTRAVENOUS | Status: DC | PRN
  Administered 2011-08-28: 200 mg via INTRAVENOUS

## 2011-08-28 SURGICAL SUPPLY — 30 items
ADH SKN CLS APL DERMABOND .7 (GAUZE/BANDAGES/DRESSINGS) ×1
APPLICATOR COTTON TIP 6IN STRL (MISCELLANEOUS) ×2 IMPLANT
BAG SPEC RTRVL LRG 6X4 10 (ENDOMECHANICALS) ×1
BLADE SURG 15 STRL LF C SS BP (BLADE) ×1 IMPLANT
BLADE SURG 15 STRL SS (BLADE) ×2
CHLORAPREP W/TINT 26ML (MISCELLANEOUS) ×2 IMPLANT
CLOTH BEACON ORANGE TIMEOUT ST (SAFETY) ×2 IMPLANT
DERMABOND ADVANCED (GAUZE/BANDAGES/DRESSINGS) ×1
DERMABOND ADVANCED .7 DNX12 (GAUZE/BANDAGES/DRESSINGS) IMPLANT
GLOVE BIO SURGEON STRL SZ7 (GLOVE) ×2 IMPLANT
GLOVE BIOGEL PI IND STRL 7.0 (GLOVE) ×2 IMPLANT
GLOVE BIOGEL PI INDICATOR 7.0 (GLOVE) ×2
GLOVE INDICATOR 6.0 STRL GRN (GLOVE) ×1 IMPLANT
GOWN PREVENTION PLUS LG XLONG (DISPOSABLE) ×5 IMPLANT
NDL INSUFFLATION 14GA 120MM (NEEDLE) IMPLANT
NEEDLE INSUFFLATION 14GA 120MM (NEEDLE) IMPLANT
NS IRRIG 1000ML POUR BTL (IV SOLUTION) ×2 IMPLANT
PACK LAPAROSCOPY BASIN (CUSTOM PROCEDURE TRAY) ×2 IMPLANT
POUCH SPECIMEN RETRIEVAL 10MM (ENDOMECHANICALS) ×1 IMPLANT
SCALPEL HARMONIC ACE (MISCELLANEOUS) IMPLANT
SET IRRIG TUBING LAPAROSCOPIC (IRRIGATION / IRRIGATOR) IMPLANT
SUT VICRYL 0 ENDOLOOP (SUTURE) IMPLANT
SUT VICRYL 0 UR6 27IN ABS (SUTURE) ×6 IMPLANT
SUT VICRYL 4-0 PS2 18IN ABS (SUTURE) ×6 IMPLANT
TOWEL OR 17X24 6PK STRL BLUE (TOWEL DISPOSABLE) ×4 IMPLANT
TRAY FOLEY CATH 14FR (SET/KITS/TRAYS/PACK) ×2 IMPLANT
TROCAR BALLN 12MMX100 BLUNT (TROCAR) ×1 IMPLANT
TROCAR XCEL NON-BLD 11X100MML (ENDOMECHANICALS) ×2 IMPLANT
TROCAR XCEL NON-BLD 5MMX100MML (ENDOMECHANICALS) ×4 IMPLANT
WATER STERILE IRR 1000ML POUR (IV SOLUTION) ×2 IMPLANT

## 2011-08-28 NOTE — ED Notes (Signed)
Pt. Taken to OR via stretcher 

## 2011-08-28 NOTE — Transfer of Care (Signed)
Immediate Anesthesia Transfer of Care Note  Patient: Cathy Sweeney  Procedure(s) Performed:  LAPAROSCOPY OPERATIVE - Ectopic  Patient Location: PACU  Anesthesia Type: General  Level of Consciousness: awake, alert  and oriented  Airway & Oxygen Therapy: Patient Spontanous Breathing and Patient connected to nasal cannula oxygen  Post-op Assessment: Report given to PACU RN  Post vital signs: Reviewed  Complications: No apparent anesthesia complications

## 2011-08-28 NOTE — Op Note (Signed)
Cathy Sweeney PROCEDURE DATE: 08/28/2011  PREOPERATIVE DIAGNOSIS: Living ectopic pregnancy POSTOPERATIVE DIAGNOSIS: Living right fallopian tube ectopic pregnancy PROCEDURE: Laparoscopic right salpingectomy and removal of ectopic pregnancy SURGEON:  Dr. Elsie Lincoln ASSISTANT: Dr. Candelaria Celeste ANESTHESIOLOGIST: Rodman Pickle  INDICATIONS: 25 y.o. [name] at [redacted]w[redacted]d here for with living right ectopic pregnancy. Patient was counseled regarding need for laparoscopic salpingectomy. Risks of surgery including bleeding which may require transfusion or reoperation, infection, injury to bowel or other surrounding organs, need for additional procedures including laparotomy and other postoperative/anesthesia complications were explained to patient.  Written informed consent was obtained.  FINDINGS:  Dilated right fallopian tube containing ectopic gestation. Small normal appearing uterus, surgically absent left fallopian tube.  Bilateral normal ovaries ANESTHESIA: General ESTIMATED BLOOD LOSS: 50 ml SPECIMENS: right fallopian tube containing ectopic gestation COMPLICATIONS: None immediate  PROCEDURE IN DETAIL:  The patient was taken to the operating room where general anesthesia was administered and was found to be adequate.  She was placed in the dorsal lithotomy position, and was prepped and draped in a sterile manner.  A Foley catheter was inserted into her bladder and attached to constant drainage and a uterine manipulator was then advanced into the uterus .  After an adequate timeout was performed, attention was then turned to the patient's abdomen where a 10-mm skin incision was made on the umbilical fold.  The Veress needle was carefully introduced into the peritoneal cavity at a 45-degree angle into the abdominal wall.  Intraperitoneal placement was confirmed by drop in intraabdominal pressure with insufflation of carbon dioxide gas.  Adequate pneumoperitoneum was obtained, and the 10/11 XL trocar and  sleeve were then advanced without difficulty into the abdomen where intraabdominal placement was confirmed by the laparoscope. A survey of the patient's pelvis and abdomen revealed the findings as above.  Bilateral lower quadrant ports were placed under direct visualization; 10-mm on the left and 5-mm on the right.  The 10-mm Nezhat suction irrigator was then used to suction the hemoperitoneum and irrigate the pelvis.  Attention was then turned to the right fallopian tube which was grasped and ligated from the underlying mesosalpinx and uterine attachment using the Harmonic scapel instrument.  Good hemostasis was noted.  The specimen was placed in an EndoCatch bag and removed from the abdomen intact.  Adhesions involving the right adnexa and cul de sac were released.  The abdomen was desufflated, and all instruments were removed.  The fascial incisions of both 10-mm sites were reapproximated with 0 Vicryl figure-of-eight stiches; and all skin incisions were closed with a Dermabond. The Hulka clip and foley were removed.  The patient tolerated the procedures well.  All instruments, needles, and sponge counts were correct x 2. The patient was taken to the recovery room in stable condition.   Given the amount of blood loss, patient will be observed overnight in the hospital.  Will discharge in the morning if she remains stable.  Will recheck hemoglobin later tonight and ascertain need for possible transfusion.  LEGGETT,KELLY H. 08/28/2011 8:48 PM

## 2011-08-28 NOTE — Anesthesia Preprocedure Evaluation (Signed)
Anesthesia Evaluation  Patient identified by MRN, date of birth, ID band Patient awake    Reviewed: Allergy & Precautions, H&P , NPO status , Patient's Chart, lab work & pertinent test results, reviewed documented beta blocker date and time   History of Anesthesia Complications Negative for: history of anesthetic complications  Airway Mallampati: I TM Distance: >3 FB Neck ROM: full    Dental  (+) Teeth Intact   Pulmonary Current Smoker,  clear to auscultation  Pulmonary exam normal       Cardiovascular neg cardio ROS regular Normal    Neuro/Psych Negative Neurological ROS  Negative Psych ROS   GI/Hepatic negative GI ROS, Neg liver ROS,   Endo/Other  Negative Endocrine ROS  Renal/GU negative Renal ROS  Genitourinary negative   Musculoskeletal   Abdominal   Peds  Hematology negative hematology ROS (+)   Anesthesia Other Findings   Reproductive/Obstetrics negative OB ROS                           Anesthesia Physical Anesthesia Plan  ASA: II and Emergent  Anesthesia Plan: General ETT   Post-op Pain Management:    Induction:   Airway Management Planned:   Additional Equipment:   Intra-op Plan:   Post-operative Plan:   Informed Consent: I have reviewed the patients History and Physical, chart, labs and discussed the procedure including the risks, benefits and alternatives for the proposed anesthesia with the patient or authorized representative who has indicated his/her understanding and acceptance.   Dental Advisory Given  Plan Discussed with: CRNA and Surgeon  Anesthesia Plan Comments:         Anesthesia Quick Evaluation

## 2011-08-28 NOTE — Anesthesia Postprocedure Evaluation (Signed)
Anesthesia Post Note  Patient: Cathy Sweeney  Procedure(s) Performed:  LAPAROSCOPY OPERATIVE - Ectopic  Anesthesia type: General  Patient location: PACU  Post pain: Pain level controlled  Post assessment: Post-op Vital signs reviewed  Last Vitals:  Filed Vitals:   08/28/11 2130  BP: 110/68  Pulse: 62  Temp: 37.1 C  Resp: 13    Post vital signs: Reviewed  Level of consciousness: sedated  Complications: No apparent anesthesia complications

## 2011-08-28 NOTE — ED Provider Notes (Signed)
History     CSN: [id number] Arrival date & time: 08/28/2011 12:25 PM   None     Chief Complaint  Patient presents with  . Vaginal Bleeding    HPI Cathy Sweeney is a 25 y.o. female who presents to [facility] for vaginal spotting and low abdominal pain that started 2 weeks ago. LMP 06/20/11 and was normal, then no period in October and spotting started Nov. 20th. History of ectopic pregnancy x2. First required surgery and removal of left tube. Last pap smear one year ago and was normal. Denies nausea or vomiting. Current sex partner x 3 years. History of GC and trichomonas.   Past Medical History  Diagnosis Date  . Ectopic pregnancy     Past Surgical History  Procedure Date  . Salpingectomy     No family history on file.  History  Substance Use Topics  . Smoking status: Current Some Day Smoker    Types: Cigarettes  . Smokeless tobacco: Not on file  . Alcohol Use: Yes     occasional alcohol    OB History    Grav Para Term Preterm Abortions TAB SAB Ect Mult Living   2    2   2         Review of Systems  Constitutional: Positive for fatigue. Negative for fever, chills and diaphoresis.  HENT: Negative for ear pain, congestion, sore throat, facial swelling, neck pain, neck stiffness, dental problem and sinus pressure.   Eyes: Negative for photophobia, pain and discharge.  Respiratory: Negative for cough, chest tightness and wheezing.   Cardiovascular: Negative.   Gastrointestinal: Positive for abdominal pain and constipation. Negative for nausea, vomiting, diarrhea and abdominal distention.  Genitourinary: Positive for frequency, vaginal bleeding and vaginal discharge. Negative for dysuria, flank pain, difficulty urinating, pelvic pain and dyspareunia.  Musculoskeletal: Negative for myalgias, back pain and gait problem.  Skin: Negative for color change and rash.  Neurological: Negative for dizziness, speech difficulty, weakness, light-headedness, numbness and headaches.    Psychiatric/Behavioral: Negative for confusion and agitation.    Allergies  Review of patient's allergies indicates no known allergies.  Home Medications  No current outpatient prescriptions on file.  BP 115/86  Pulse 78  Temp(Src) 99.1 F (37.3 C) (Oral)  Resp 16  Ht 5\' 6"  (1.676 m)  Wt 156 lb 4 oz (70.875 kg)  BMI 25.22 kg/m2  LMP 08/15/2011  Physical Exam  Nursing note and vitals reviewed. Constitutional: She is oriented to person, place, and time. She appears well-developed and well-nourished. No distress.  HENT:  Head: Normocephalic.  Mouth/Throat: Oropharynx is clear and moist.  Eyes: EOM are normal.  Neck: Neck supple.  Cardiovascular: Normal rate and regular rhythm.   Pulmonary/Chest: Effort normal and breath sounds normal.  Abdominal: Soft. Normal appearance. There is tenderness in the right lower quadrant. There is no rebound and no guarding.  Genitourinary:       External genitalia without lesions. Brown vaginal discharge. Cervix closed, no CMT. Right adnexal fullness with minimal tenderness on exam. Uterus without palpable enlargement.  Musculoskeletal: Normal range of motion.  Neurological: She is alert and oriented to person, place, and time. No cranial nerve deficit.  Skin: Skin is warm and dry.  Psychiatric: She has a normal mood and affect. Her behavior is normal. Judgment and thought content normal.   Results for orders placed during the hospital encounter of 08/28/11 (from the past 24 hour(s))  URINALYSIS, ROUTINE W REFLEX MICROSCOPIC     Status: Abnormal  Collection Time   08/28/11 12:54 PM      Component Value Range   Color, Urine YELLOW  YELLOW    APPearance CLEAR  CLEAR    Specific Gravity, Urine 1.025  1.005 - 1.030    pH 6.0  5.0 - 8.0    Glucose, UA NEGATIVE  NEGATIVE (mg/dL)   Hgb urine dipstick SMALL (*) NEGATIVE    Bilirubin Urine NEGATIVE  NEGATIVE    Ketones, ur NEGATIVE  NEGATIVE (mg/dL)   Protein, ur NEGATIVE  NEGATIVE (mg/dL)    Urobilinogen, UA 0.2  0.0 - 1.0 (mg/dL)   Nitrite NEGATIVE  NEGATIVE    Leukocytes, UA NEGATIVE  NEGATIVE   URINE MICROSCOPIC-ADD ON     Status: Abnormal   Collection Time   08/28/11 12:54 PM      Component Value Range   Squamous Epithelial / LPF FEW (*) RARE    RBC / HPF 3-6  <3 (RBC/hpf)   Bacteria, UA FEW (*) RARE    Urine-Other MUCOUS PRESENT    POCT PREGNANCY, URINE     Status: Normal   Collection Time   08/28/11  1:21 PM      Component Value Range   Preg Test, Ur POSITIVE    CBC     Status: Abnormal   Collection Time   08/28/11  1:36 PM      Component Value Range   WBC 9.2  4.0 - 10.5 (K/uL)   RBC 3.91  3.87 - 5.11 (MIL/uL)   Hemoglobin 12.6  12.0 - 15.0 (g/dL)   HCT 16.1 (*) 09.6 - 46.0 (%)   MCV 89.8  78.0 - 100.0 (fL)   MCH 32.2  26.0 - 34.0 (pg)   MCHC 35.9  30.0 - 36.0 (g/dL)   RDW 04.5  40.9 - 81.1 (%)   Platelets 180  150 - 400 (K/uL)  ABO/RH     Status: Normal   Collection Time   08/28/11  1:36 PM      Component Value Range   ABO/RH(D) O POS    HCG, QUANTITATIVE, PREGNANCY     Status: Abnormal   Collection Time   08/28/11  1:36 PM      Component Value Range   hCG, Beta Chain, Quant, S 6403 (*) <5 (mIU/mL)   Call from Radiologist: Ultrasound = 6 week 1 day living ectopic in the right adnexa.  Assessment: Living right ectopic pregnancy  Plan:  Dr. Macon Large in to evaluate patient and discuss plan of care.   [place], [place] 08/28/11 1521  ED Course  Procedures   Attending Note 25 y.o. G3P0020 with [redacted]w[redacted]d viable ectopic in the right adnexa, history of two prior ectopic pregnancies s/p left salpingectomy.  On exam, she had stable vital signs, O+, Hgb 12.6, HCG 6402.   Patient was counseled regarding need for operative laparoscopy, possible salpingostomy, possible salpingectomy, possible laparotomy.  Risks of surgery including bleeding which may require transfusion or reoperation, infection, injury to bowel or other surrounding organs, need for additional  procedures including laparotomy were explained to patient and written informed consent was obtained.  Patient has been NPO since 10:30 am and she will remain NPO for procedure. Anesthesia and OR aware; case to go around 1830.  Preoperative prophylactic antibiotics on call to the OR.  To OR when ready.  Jaynie Collins, M.D. 08/28/2011 4:01 PM

## 2011-08-28 NOTE — Preoperative (Signed)
Beta Blockers   Reason not to administer Beta Blockers:Not Applicable 

## 2011-08-28 NOTE — Progress Notes (Signed)
Pt states LMP-08/15/2011, last intercourse 2.5 weeks ago, has been spotting x2 weeks, pain intermittent/cramping on r lower side of abd only. Denies abnormal vag d/c. Hx of 2 prior ectopics.

## 2011-08-28 NOTE — H&P (Signed)
CSN: [id number]  Arrival date & time: 08/28/2011 12:25 PM  None  Chief Complaint   Patient presents with   .  Vaginal Bleeding    HPI Cathy Sweeney is a 25 y.o. female who presents to [facility] for vaginal spotting and low abdominal pain that started 2 weeks ago. LMP 06/20/11 and was normal, then no period in October and spotting started Nov. 20th. History of ectopic pregnancy x2. First required surgery and removal of left tube. Last pap smear one year ago and was normal. Denies nausea or vomiting. Current sex partner x 3 years. History of GC and trichomonas.  Past Medical History   Diagnosis  Date   .  Ectopic pregnancy     Past Surgical History   Procedure  Date   .  Salpingectomy     No family history on file.  History   Substance Use Topics   .  Smoking status:  Current Some Day Smoker     Types:  Cigarettes   .  Smokeless tobacco:  Not on file   .  Alcohol Use:  Yes      occasional alcohol    OB History    Grav  Para  Term  Preterm  Abortions  TAB  SAB  Ect  Mult  Living    2     2    2         Review of Systems  Constitutional: Positive for fatigue. Negative for fever, chills and diaphoresis.  HENT: Negative for ear pain, congestion, sore throat, facial swelling, neck pain, neck stiffness, dental problem and sinus pressure.  Eyes: Negative for photophobia, pain and discharge.  Respiratory: Negative for cough, chest tightness and wheezing.  Cardiovascular: Negative.  Gastrointestinal: Positive for abdominal pain and constipation. Negative for nausea, vomiting, diarrhea and abdominal distention.  Genitourinary: Positive for frequency, vaginal bleeding and vaginal discharge. Negative for dysuria, flank pain, difficulty urinating, pelvic pain and dyspareunia.  Musculoskeletal: Negative for myalgias, back pain and gait problem.  Skin: Negative for color change and rash.  Neurological: Negative for dizziness, speech difficulty, weakness, light-headedness, numbness and headaches.    Psychiatric/Behavioral: Negative for confusion and agitation.   Allergies   Review of patient's allergies indicates no known allergies.  Home Medications   No current outpatient prescriptions on file.  BP 115/86  Pulse 78  Temp(Src) 99.1 F (37.3 C) (Oral)  Resp 16  Ht 5\' 6"  (1.676 m)  Wt 156 lb 4 oz (70.875 kg)  BMI 25.22 kg/m2  LMP 08/15/2011  Physical Exam  Nursing note and vitals reviewed.  Constitutional: She is oriented to person, place, and time. She appears well-developed and well-nourished. No distress.  HENT:  Head: Normocephalic.  Mouth/Throat: Oropharynx is clear and moist.  Eyes: EOM are normal.  Neck: Neck supple.  Cardiovascular: Normal rate and regular rhythm.  Pulmonary/Chest: Effort normal and breath sounds normal.  Abdominal: Soft. Normal appearance. There is tenderness in the right lower quadrant. There is no rebound and no guarding.  Genitourinary:  External genitalia without lesions. Brown vaginal discharge. Cervix closed, no CMT. Right adnexal fullness with minimal tenderness on exam. Uterus without palpable enlargement.  Musculoskeletal: Normal range of motion.  Neurological: She is alert and oriented to person, place, and time. No cranial nerve deficit.  Skin: Skin is warm and dry.  Psychiatric: She has a normal mood and affect. Her behavior is normal. Judgment and thought content normal.   Results for orders placed during the hospital encounter  of 08/28/11 (from the past 24 hour(s))   URINALYSIS, ROUTINE W REFLEX MICROSCOPIC Status: Abnormal    Collection Time    08/28/11 12:54 PM   Component  Value  Range    Color, Urine  YELLOW  YELLOW    APPearance  CLEAR  CLEAR    Specific Gravity, Urine  1.025  1.005 - 1.030    pH  6.0  5.0 - 8.0    Glucose, UA  NEGATIVE  NEGATIVE (mg/dL)    Hgb urine dipstick  SMALL (*)  NEGATIVE    Bilirubin Urine  NEGATIVE  NEGATIVE    Ketones, ur  NEGATIVE  NEGATIVE (mg/dL)    Protein, ur  NEGATIVE  NEGATIVE (mg/dL)     Urobilinogen, UA  0.2  0.0 - 1.0 (mg/dL)    Nitrite  NEGATIVE  NEGATIVE    Leukocytes, UA  NEGATIVE  NEGATIVE   URINE MICROSCOPIC-ADD ON Status: Abnormal    Collection Time    08/28/11 12:54 PM   Component  Value  Range    Squamous Epithelial / LPF  FEW (*)  RARE    RBC / HPF  3-6  <3 (RBC/hpf)    Bacteria, UA  FEW (*)  RARE    Urine-Other  MUCOUS PRESENT    POCT PREGNANCY, URINE Status: Normal    Collection Time    08/28/11 1:21 PM   Component  Value  Range    Preg Test, Ur  POSITIVE    CBC Status: Abnormal    Collection Time    08/28/11 1:36 PM   Component  Value  Range    WBC  9.2  4.0 - 10.5 (K/uL)    RBC  3.91  3.87 - 5.11 (MIL/uL)    Hemoglobin  12.6  12.0 - 15.0 (g/dL)    HCT  21.3 (*)  08.6 - 46.0 (%)    MCV  89.8  78.0 - 100.0 (fL)    MCH  32.2  26.0 - 34.0 (pg)    MCHC  35.9  30.0 - 36.0 (g/dL)    RDW  57.8  46.9 - 62.9 (%)    Platelets  180  150 - 400 (K/uL)   ABO/RH Status: Normal    Collection Time    08/28/11 1:36 PM   Component  Value  Range    ABO/RH(D)  O POS    HCG, QUANTITATIVE, PREGNANCY Status: Abnormal    Collection Time    08/28/11 1:36 PM   Component  Value  Range    hCG, Beta Chain, Quant, S  6403 (*)  <5 (mIU/mL)    Call from Radiologist:  Ultrasound = 6 week 1 day living ectopic in the right adnexa.  Assessment: Living right ectopic pregnancy  Plan: Dr. Macon Large in to evaluate patient and discuss plan of care.  [place], [place]  08/28/11 1521  ED Course   Procedures  Attending Note  25 y.o. G3P0020 with [redacted]w[redacted]d viable ectopic in the right adnexa, history of two prior ectopic pregnancies s/p left salpingectomy. On exam, she had stable vital signs, O+, Hgb 12.6, HCG 6402. Patient was counseled regarding need for operative laparoscopy, possible salpingostomy, possible salpingectomy, possible laparotomy. Risks of surgery including bleeding which may require transfusion or reoperation, infection, injury to bowel or other surrounding organs, need for  additional procedures including laparotomy were explained to patient and written informed consent was obtained. Patient has been NPO since 10:30 am and she will remain NPO for procedure. Anesthesia and OR  aware; case to go around 1830. Preoperative prophylactic antibiotics on call to the OR. To OR when ready.  Jaynie Collins, M.D.  08/28/2011 4:01 PM  Addendum:  Pt seen and examined.  Right living ectopic.  Pt has had 3 ectopics.  Pt prepared for rt salpingectomy.  Pt understands pregnancy will have to be achieved with in vitro fertilization if both tubes are surgically absent.

## 2011-08-29 ENCOUNTER — Encounter (HOSPITAL_COMMUNITY): Payer: Self-pay | Admitting: Obstetrics & Gynecology

## 2011-08-29 ENCOUNTER — Inpatient Hospital Stay (HOSPITAL_COMMUNITY)
Admission: AD | Admit: 2011-08-29 | Discharge: 2011-08-29 | Disposition: A | Discharge status: Home / Self Care | Payer: BC Managed Care – PPO | Source: Ambulatory Visit | Attending: Obstetrics & Gynecology | Admitting: Obstetrics & Gynecology

## 2011-08-29 DIAGNOSIS — IMO0002 Reserved for concepts with insufficient information to code with codable children: Secondary | ICD-10-CM | POA: Insufficient documentation

## 2011-08-29 DIAGNOSIS — Z9889 Other specified postprocedural states: Secondary | ICD-10-CM

## 2011-08-29 NOTE — Progress Notes (Signed)
Pt concerned about drainage coming from surgical site. Some dried red discharged noted.

## 2011-08-29 NOTE — ED Provider Notes (Signed)
Attestation of Attending Supervision of Advanced Practitioner: Evaluation and management procedures were performed by the PA/NP/CNM/OB Fellow under my supervision/collaboration. Chart reviewed, and agree with management and plan.  Jaynie Collins, M.D. 08/29/2011 3:58 PM

## 2011-08-29 NOTE — ED Provider Notes (Signed)
History   Pt presents today c/o "leaking" incision. She had an exploratory laparoscopy for ectopic preg yesterday. She denies fever but is concerned because she has noticed some mild yellowish drainage from one incision.  Chief Complaint  Patient presents with  . Wound Check   HPI  OB History    Grav Para Term Preterm Abortions TAB SAB Ect Mult Living   3    2   2         Past Medical History  Diagnosis Date  . Ectopic pregnancy     Past Surgical History  Procedure Date  . Salpingectomy   . Laparoscopy 08/28/2011    Procedure: LAPAROSCOPY OPERATIVE;  Surgeon: Lesly Dukes, MD;  Location: [facility];  Service: Gynecology;  Laterality: Right;  Ectopic    No family history on file.  History  Substance Use Topics  . Smoking status: Current Some Day Smoker    Types: Cigarettes  . Smokeless tobacco: Not on file  . Alcohol Use: Yes     occasional alcohol    Allergies: No Known Allergies  Prescriptions prior to admission  Medication Sig Dispense Refill  . ibuprofen (ADVIL,MOTRIN) 600 MG tablet Take 1 tablet (600 mg total) by mouth every 6 (six) hours as needed for pain.  30 tablet  0  . multivitamin (THERAGRAN) per tablet Take 1 tablet by mouth daily.        Marland Kitchen oxyCODONE-acetaminophen (ROXICET) 5-325 MG per tablet Take 1-2 tablets by mouth every 4 (four) hours as needed for pain.  40 tablet  0  . vitamin C (ASCORBIC ACID) 500 MG tablet Take 500 mg by mouth daily.          Review of Systems  Constitutional: Negative for fever.  Cardiovascular: Negative for chest pain.  Gastrointestinal: Negative for nausea, vomiting, abdominal pain, diarrhea and constipation.  Genitourinary: Negative for dysuria, urgency, frequency and hematuria.  Neurological: Negative for dizziness and headaches.  Psychiatric/Behavioral: Negative for depression and suicidal ideas.   Physical Exam   Blood pressure 116/57, pulse 65, temperature 99.5 F (37.5 C), temperature source Oral, resp. rate 16,  height 5\' 7"  (1.702 m), weight 156 lb 9.6 oz (71.033 kg), last menstrual period 08/15/2011, unknown if currently breastfeeding.  Physical Exam  Nursing note and vitals reviewed. Constitutional: She is oriented to person, place, and time. She appears well-developed and well-nourished. No distress.  HENT:  Head: Normocephalic and atraumatic.  Eyes: EOM are normal. Pupils are equal, round, and reactive to light.  GI: Soft. She exhibits no distension. There is no tenderness. There is no rebound and no guarding.  Neurological: She is alert and oriented to person, place, and time.  Skin: Skin is warm and dry. No rash noted. She is not diaphoretic. No erythema. No pallor.       Incisions are all healing well. No evidence of infection. No erythema, edema, or purulent drainage noted. Small amount of serosanguinous drainage noted.  Psychiatric: She has a normal mood and affect. Her behavior is normal. Judgment and thought content normal.    MAU Course  Procedures    Assessment and Plan  Post op: discussed wound care instructions and precautions. She has f/u scheduled. Discussed diet, activity, risks, and precautions.  Clinton Gallant. Freeman Borba III, DrHSc, MPAS, PA-C  08/29/2011, 3:50 PM   Henrietta Hoover, PA 08/29/11 1553

## 2011-08-29 NOTE — Progress Notes (Signed)
Patient states she had a laparoscopy last night for a ruptured ectopic on the right side. States the incision on the lower right of the abdomen has been leaking. No active leaking in triage. States she is having a little gas pain and a little pain at the incision that is leaking.

## 2011-09-05 ENCOUNTER — Telehealth: Payer: Self-pay | Admitting: *Deleted

## 2011-09-05 NOTE — Telephone Encounter (Signed)
Pt left message stating that she had surgery on 08/28/11 and was told to make a 2wk follow up appt.  Pt has called and was told that next available appt is 09/27/11. She would like an earlier appt.  Request routed to [facility]- clinic manager.

## 2011-09-05 NOTE — Telephone Encounter (Signed)
Appointment moved to 09/11/11 at 3:45 pm with Deirdre Poe.  Patient was contacted and appointment given.

## 2011-09-11 ENCOUNTER — Ambulatory Visit (INDEPENDENT_AMBULATORY_CARE_PROVIDER_SITE_OTHER): Payer: BC Managed Care – PPO | Admitting: Obstetrics & Gynecology

## 2011-09-11 ENCOUNTER — Encounter: Payer: Self-pay | Admitting: Obstetrics and Gynecology

## 2011-09-11 DIAGNOSIS — Z09 Encounter for follow-up examination after completed treatment for conditions other than malignant neoplasm: Secondary | ICD-10-CM | POA: Insufficient documentation

## 2011-09-11 DIAGNOSIS — D62 Acute posthemorrhagic anemia: Secondary | ICD-10-CM

## 2011-09-11 DIAGNOSIS — O009 Unspecified ectopic pregnancy without intrauterine pregnancy: Secondary | ICD-10-CM | POA: Insufficient documentation

## 2011-09-11 MED ORDER — FERROUS SULFATE 325 (65 FE) MG PO TABS: 325.0000 mg | ORAL_TABLET | Freq: Every day | ORAL | Status: DC

## 2011-09-11 NOTE — Progress Notes (Signed)
  Subjective:    Patient ID: Cathy Sweeney, female    DOB: [birth year], 25 y.o.   MRN: [id number]  HPI [id number] Patient's last menstrual period was 08/15/2011.  S/P laparoscopic right salpingectomy 12/3 by Dr. Penne Lash. Left tube has also been removed she says.  Past Medical History  Diagnosis Date  . Ectopic pregnancy    Past Surgical History  Procedure Date  . Salpingectomy   . Laparoscopy 08/28/2011    Procedure: LAPAROSCOPY OPERATIVE;  Surgeon: Lesly Dukes, MD;  Location: [facility];  Service: Gynecology;  Laterality: Right;  Ectopic   Current Outpatient Prescriptions on File Prior to Visit  Medication Sig Dispense Refill  . multivitamin (THERAGRAN) per tablet Take 1 tablet by mouth daily.        . vitamin C (ASCORBIC ACID) 500 MG tablet Take 500 mg by mouth daily.        : she Review of Systems headache    Objective:   Physical Exam  NAD, not pale Incisions healing well, not tender Pelvic deferred CBC    Component Value Date/Time   WBC 9.2 08/28/2011 1336   RBC 3.91 08/28/2011 1336   HGB 12.6 08/28/2011 1336   HCT 35.1* 08/28/2011 1336   PLT 180 08/28/2011 1336   MCV 89.8 08/28/2011 1336   MCH 32.2 08/28/2011 1336   MCHC 35.9 08/28/2011 1336   RDW 13.6 08/28/2011 1336   LYMPHSABS 7.4* 02/10/2010 1806   MONOABS 0.0* 02/10/2010 1806   EOSABS 0.0 02/10/2010 1806   BASOSABS 0.0 02/10/2010 1806        Assessment & Plan:  Post op, may be mildly anemic Iron sulfate daily Report if not improving Keyontae Huckeby

## 2011-09-11 NOTE — Patient Instructions (Signed)
Diagnostic Laparoscopy Laparoscopy is a surgical procedure. It is used to diagnose and treat diseases inside the belly(abdomen). It is usually a brief, common, and relatively simple procedure. The laparoscopeis a thin, lighted, pencil-sized instrument. It is like a telescope. It is inserted into your abdomen through a small cut (incision). Your caregiver can look at the organs inside your body through this instrument. He or she can see if there is anything abnormal. Laparoscopy can be done either in a hospital or outpatient clinic. You may be given a mild sedative to help you relax before the procedure. Once in the operating room, you will be given a drug to make you sleep (general anesthesia). Laparoscopy usually lasts less than 1 hour. After the procedure, you will be monitored in a recovery area until you are stable and doing well. Once you are home, it will take 2 to 3 days to fully recover. RISKS AND COMPLICATIONS  Laparoscopy has relatively few risks. Your caregiver will discuss the risks with you before the procedure. Some problems that can occur include:  Infection.   Bleeding.   Damage to other organs.   Anesthetic side effects.  PROCEDURE Once you receive anesthesia, your surgeon inflates the abdomen with a harmless gas (carbon dioxide). This makes the organs easier to see. The laparoscope is inserted into the abdomen through a small incision. This allows your surgeon to see into the abdomen. Other small instruments are also inserted into the abdomen through other small openings. Many surgeons attach a video camera to the laparoscope to enlarge the view. During a diagnostic laparoscopy, the surgeon may be looking for inflammation, infection, or cancer. Your surgeon may take tissue samples(biopsies). The samples are sent to a specialist in looking at cells and tissue samples (pathologist). The pathologist examines them under a microscope. Biopsies can help to diagnose or confirm a  disease. AFTER THE PROCEDURE   The gas is released from inside the abdomen.   The incisions are closed with stitches (sutures). Because these incisions are small (usually less than 1/2 inch), there is usually minimal discomfort after the procedure. There may be some mild discomfort in the throat. This is from the tube placed in the throat while you were sleeping. You may have some mild abdominal discomfort. There may also be discomfort from the instrument placement incisions in the abdomen.   The recovery time is shortened as long as there are no complications.   You will rest in a recovery room until stable and doing well. As long as there are no complications, you may be allowed to go home.  FINDING OUT THE RESULTS OF YOUR TEST Not all test results are available during your visit. If your test results are not back during the visit, make an appointment with your caregiver to find out the results. Do not assume everything is normal if you have not heard from your caregiver or the medical facility. It is important for you to follow up on all of your test results. HOME CARE INSTRUCTIONS   Take all medicines as directed.   Only take over-the-counter or prescription medicines for pain, discomfort, or fever as directed by your caregiver.   Resume daily activities as directed.   Showers are preferred over baths.   You may resume sexual activities in 1 week or as directed.   Do not drive while taking narcotics.  SEEK MEDICAL CARE IF:   There is increasing abdominal pain.   There is new pain in the shoulders (shoulder strap areas).     You feel lightheaded or faint.   You have the chills.   You or your child has an oral temperature above 102 F (38.9 C).   There is pus-like (purulent) drainage from any of the wounds.   You are unable to pass gas or have a bowel movement.   You feel sick to your stomach (nauseous) or throw up (vomit).  MAKE SURE YOU:   Understand these instructions.    Will watch your condition.   Will get help right away if you are not doing well or get worse.  Document Released: 12/18/2000 Document Revised: 05/24/2011 Document Reviewed: 09/11/2007 [employer] Patient Information 2012 [employer]. 

## 2011-09-27 ENCOUNTER — Ambulatory Visit: Payer: BC Managed Care – PPO | Admitting: Obstetrics & Gynecology

## 2011-12-13 IMAGING — US US OB TRANSVAGINAL MODIFY
1 series · 14 of 28 positions shown · non-contrast
Comparison: None.

CLINICAL DATA: Pelvic pain and bleeding.  Positive pregnancy test.
1-week-2-day gestational age by LMP.

OBSTETRIC <14 WK US AND TRANSVAGINAL OB US
TECHNIQUE: Both transabdominal and transvaginal ultrasound
examinations were performed for complete evaluation of the
gestation as well as the maternal uterus, adnexal regions, and
pelvic cul-de-sac.

[Series 1: us ob transvaginal modify · 0.21mm/px · 14 of 72 slices shown]
[im 3/72]
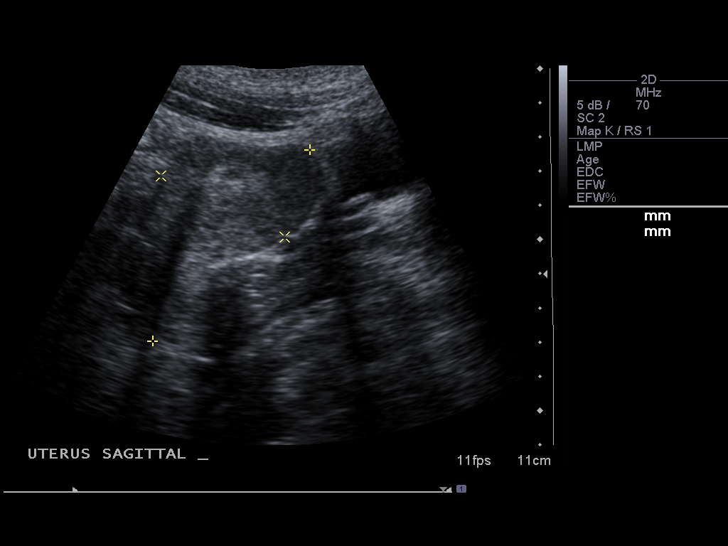
[im 8/72]
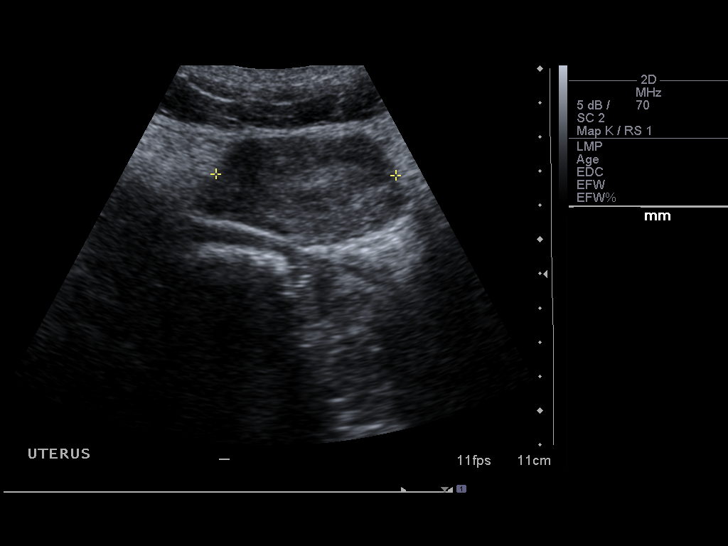
[im 14/72]
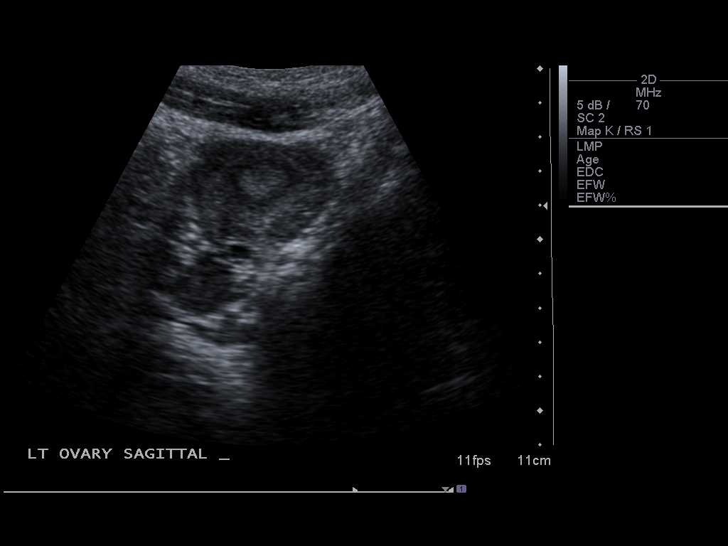
[im 19/72]
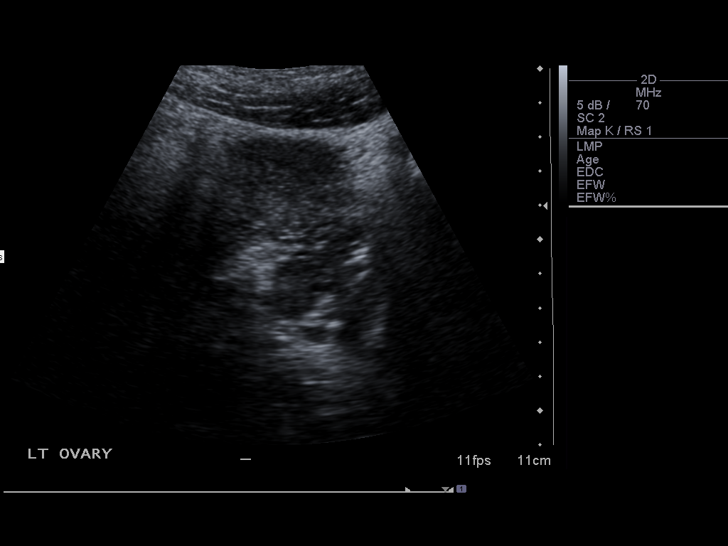
[im 24/72]
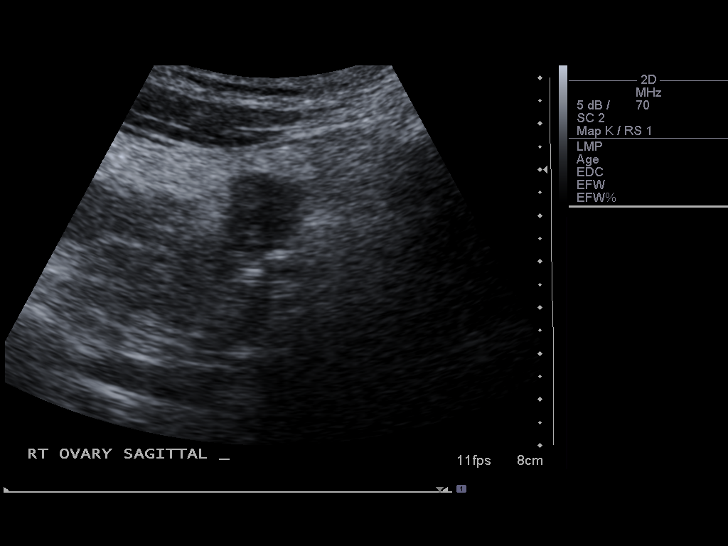
[im 29/72]
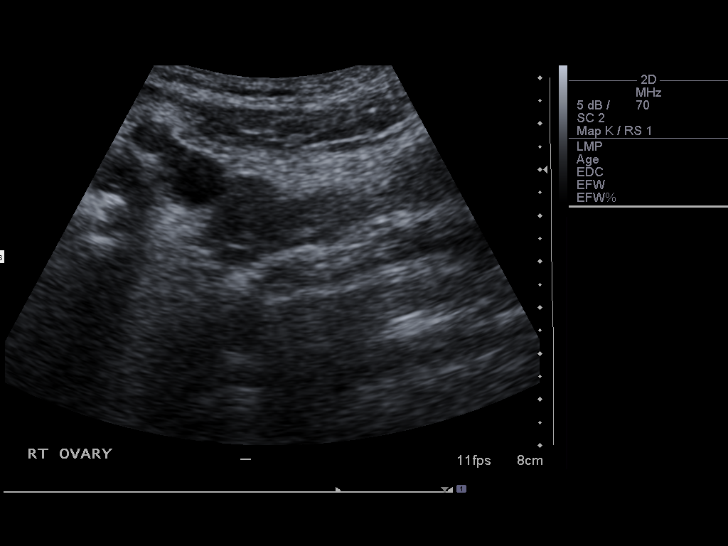
[im 35/72]
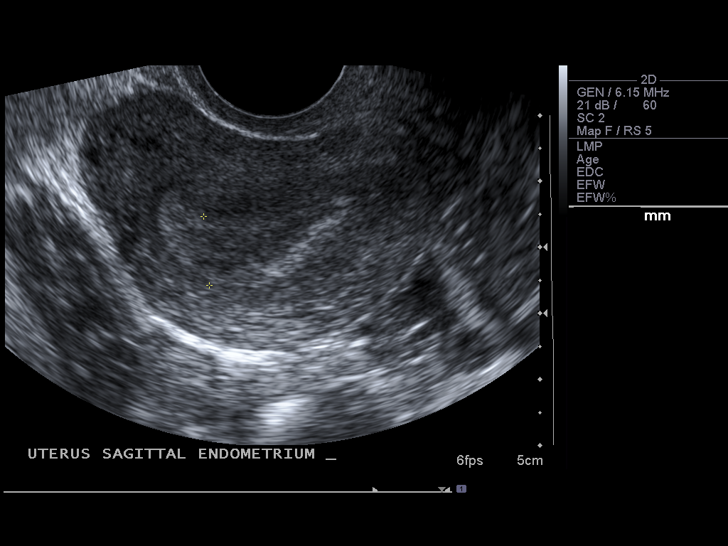
[im 40/72]
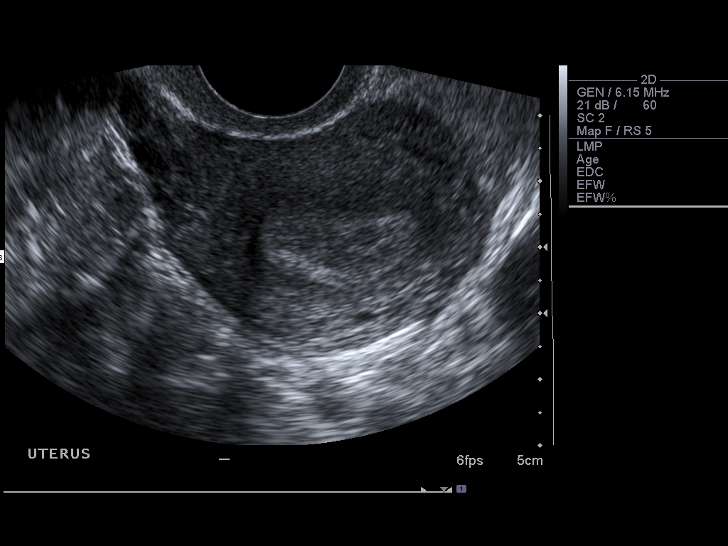
[im 45/72]
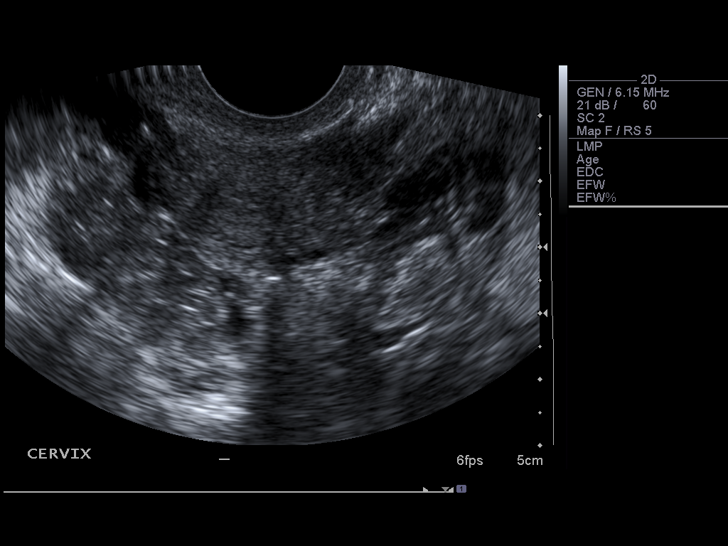
[im 50/72]
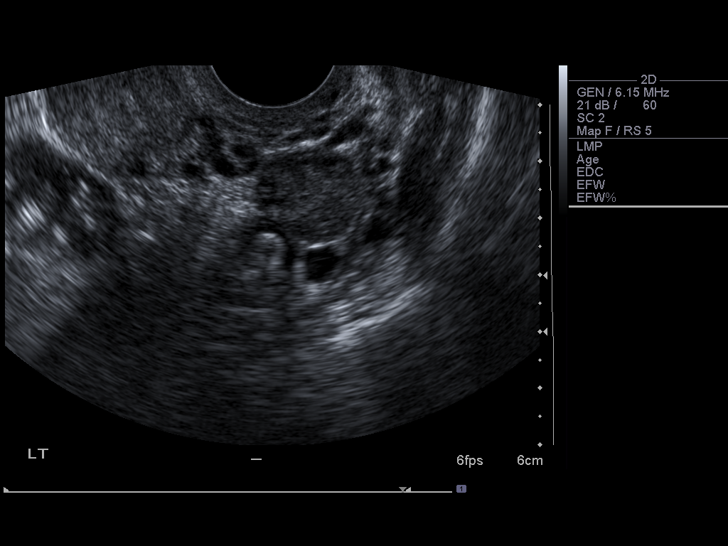
[im 56/72]
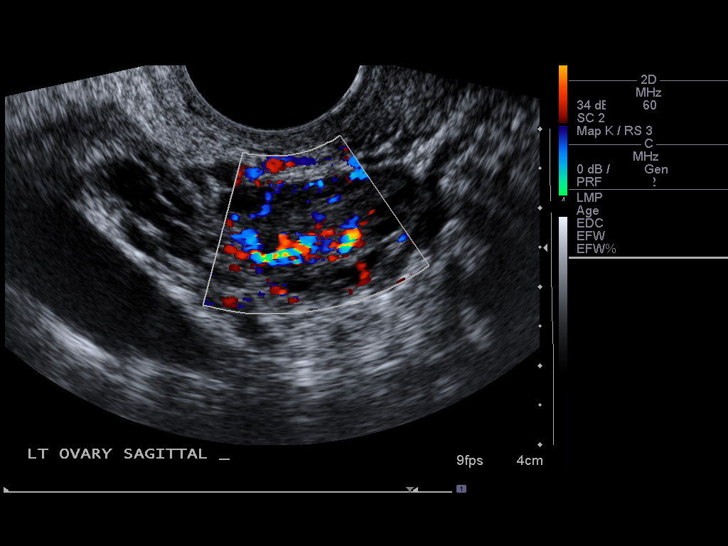
[im 61/72]
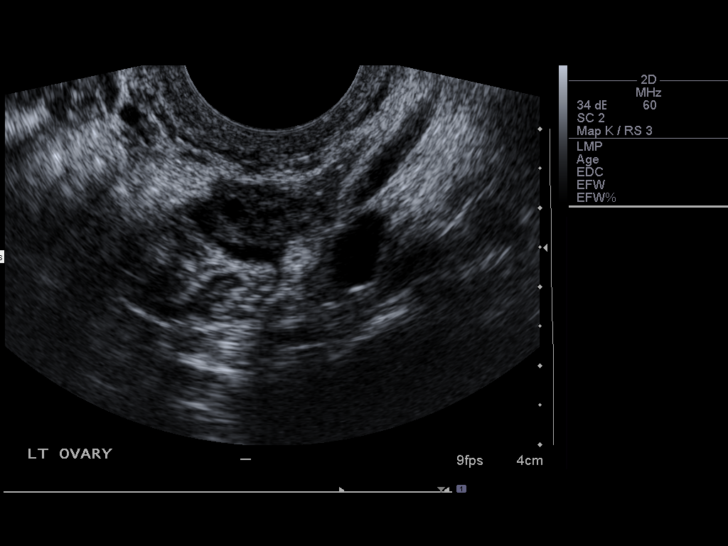
[im 66/72]
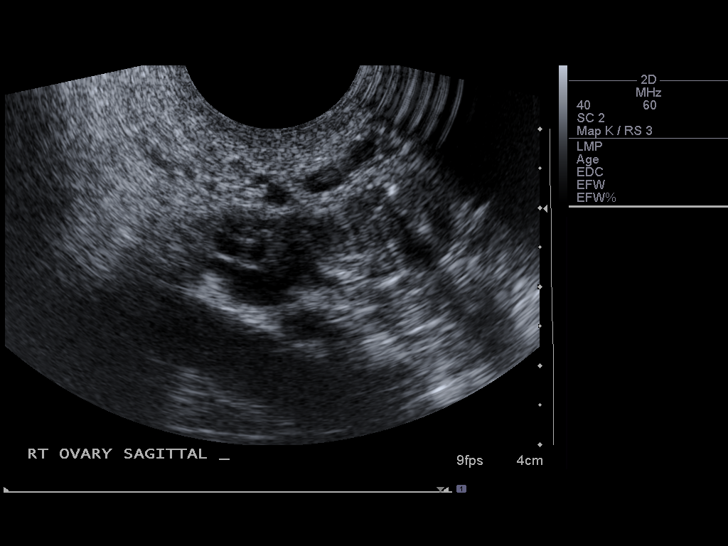
[im 72/72]
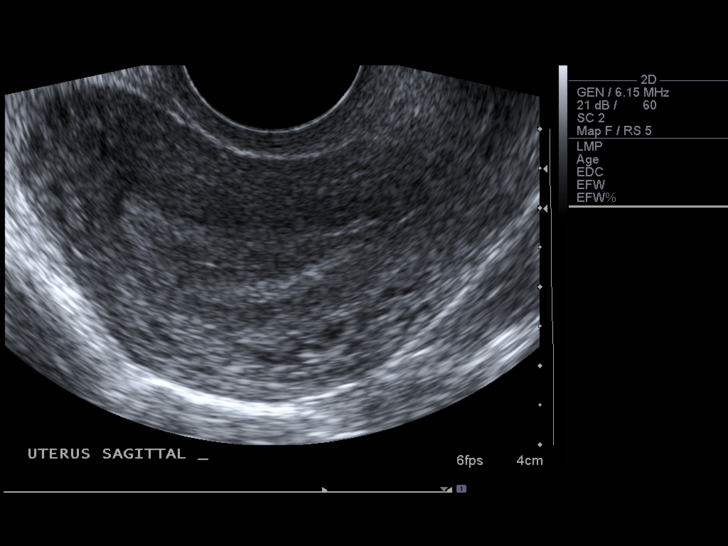

[14 of 28 positions shown; findings below may reference images not displayed]

FINDINGS: No intrauterine gestational sac identified.  No other
fluid collection is visualized within the endometrial cavity.  No
fibroids or other uterine masses identified.

Both ovaries are normal in appearance.  There is no evidence of
adnexal mass free fluid.
IMPRESSION: No evidence of intrauterine gestational sac or adnexal mass.
Differential diagnosis includes recent spontaneous abortion, IUP
too early to visualize, or an occult ectopic pregnancy.  Close
follow up of quantitative beta HCG levels is recommended, as well
as ultrasound followup if clinically warranted.

## 2013-01-02 ENCOUNTER — Emergency Department (HOSPITAL_COMMUNITY)
Admission: EM | Admit: 2013-01-02 | Discharge: 2013-01-02 | Disposition: A | Discharge status: Home / Self Care | Payer: Self-pay | Attending: Emergency Medicine | Admitting: Emergency Medicine

## 2013-01-02 ENCOUNTER — Encounter (HOSPITAL_COMMUNITY): Payer: Self-pay

## 2013-01-02 DIAGNOSIS — R109 Unspecified abdominal pain: Secondary | ICD-10-CM | POA: Insufficient documentation

## 2013-01-02 DIAGNOSIS — Z3202 Encounter for pregnancy test, result negative: Secondary | ICD-10-CM | POA: Insufficient documentation

## 2013-01-02 DIAGNOSIS — F17200 Nicotine dependence, unspecified, uncomplicated: Secondary | ICD-10-CM | POA: Insufficient documentation

## 2013-01-02 DIAGNOSIS — R112 Nausea with vomiting, unspecified: Secondary | ICD-10-CM | POA: Insufficient documentation

## 2013-01-02 DIAGNOSIS — R110 Nausea: Secondary | ICD-10-CM

## 2013-01-02 DIAGNOSIS — R197 Diarrhea, unspecified: Secondary | ICD-10-CM | POA: Insufficient documentation

## 2013-01-02 DIAGNOSIS — N898 Other specified noninflammatory disorders of vagina: Secondary | ICD-10-CM | POA: Insufficient documentation

## 2013-01-02 DIAGNOSIS — Z9079 Acquired absence of other genital organ(s): Secondary | ICD-10-CM | POA: Insufficient documentation

## 2013-01-02 DIAGNOSIS — Z9889 Other specified postprocedural states: Secondary | ICD-10-CM | POA: Insufficient documentation

## 2013-01-02 LAB — LIPASE, BLOOD: Lipase: 21 U/L (ref 11–59)

## 2013-01-02 LAB — CBC WITH DIFFERENTIAL/PLATELET
Basophils Relative: 0 % (ref 0–1)
Eosinophils Absolute: 0 10*3/uL (ref 0.0–0.7)
HCT: 39.2 % (ref 36.0–46.0)
Hemoglobin: 13.8 g/dL (ref 12.0–15.0)
Lymphs Abs: 1.7 10*3/uL (ref 0.7–4.0)
MCH: 31 pg (ref 26.0–34.0)
MCHC: 35.2 g/dL (ref 30.0–36.0)
Monocytes Absolute: 1 10*3/uL (ref 0.1–1.0)
Monocytes Relative: 11 % (ref 3–12)
Neutrophils Relative %: 71 % (ref 43–77)
RBC: 4.45 MIL/uL (ref 3.87–5.11)

## 2013-01-02 LAB — COMPREHENSIVE METABOLIC PANEL
Albumin: 4 g/dL (ref 3.5–5.2)
Alkaline Phosphatase: 73 U/L (ref 39–117)
BUN: 8 mg/dL (ref 6–23)
Chloride: 100 mEq/L (ref 96–112)
Creatinine, Ser: 0.76 mg/dL (ref 0.50–1.10)
GFR calc Af Amer: >90 mL/min (ref >90)
Glucose, Bld: 111 mg/dL — ABNORMAL HIGH (ref 70–99)
Potassium: 3.4 mEq/L — ABNORMAL LOW (ref 3.5–5.1)
Total Bilirubin: 0.4 mg/dL (ref 0.3–1.2)
Total Protein: 7.7 g/dL (ref 6.0–8.3)

## 2013-01-02 LAB — WET PREP, GENITAL: WBC, Wet Prep HPF POC: NONE SEEN

## 2013-01-02 LAB — URINALYSIS, ROUTINE W REFLEX MICROSCOPIC
Bilirubin Urine: NEGATIVE
Ketones, ur: NEGATIVE mg/dL
Leukocytes, UA: NEGATIVE
Nitrite: NEGATIVE
Urobilinogen, UA: 2 mg/dL — ABNORMAL HIGH (ref 0.0–1.0)

## 2013-01-02 LAB — URINE MICROSCOPIC-ADD ON

## 2013-01-02 LAB — POCT PREGNANCY, URINE: Preg Test, Ur: NEGATIVE

## 2013-01-02 MED ORDER — SODIUM CHLORIDE 0.9 % IV BOLUS (SEPSIS)
1000.0000 mL | Freq: Once | INTRAVENOUS | Status: AC
  Administered 2013-01-02: 1000 mL via INTRAVENOUS

## 2013-01-02 MED ORDER — MORPHINE SULFATE 4 MG/ML IJ SOLN
4.0000 mg | Freq: Once | INTRAMUSCULAR | Status: AC
  Administered 2013-01-02: 4 mg via INTRAVENOUS
  Filled 2013-01-02: qty 1

## 2013-01-02 MED ORDER — ONDANSETRON HCL 4 MG/2ML IJ SOLN
4.0000 mg | Freq: Once | INTRAMUSCULAR | Status: AC
  Administered 2013-01-02: 4 mg via INTRAVENOUS
  Filled 2013-01-02: qty 2

## 2013-01-02 MED ORDER — TRAMADOL HCL 50 MG PO TABS: 50.0000 mg | ORAL_TABLET | Freq: Four times a day (QID) | ORAL | Status: DC | PRN

## 2013-01-02 MED ORDER — ONDANSETRON 4 MG PO TBDP: 4.0000 mg | ORAL_TABLET | Freq: Three times a day (TID) | ORAL | Status: DC | PRN

## 2013-01-02 NOTE — ED Notes (Signed)
Pelvic cart set up 

## 2013-01-02 NOTE — ED Notes (Signed)
Pt unable to provide urine sample at this time. Will continue to monitor.

## 2013-01-02 NOTE — ED Provider Notes (Signed)
History     CSN: [id number]  Arrival date & time 01/02/13  1057   First MD Initiated Contact with Patient 01/02/13 1118      Chief Complaint  Patient presents with  . Abdominal Pain    (Consider location/radiation/quality/duration/timing/severity/associated sxs/prior treatment) HPI Comments: Pt presents to the ED for sudden onset nausea, vomiting, diarrhea, and diffuse, cramping abdominal pain since yesterday morning.  Diarrhea and emesis were non-bloody and resolved this am.  Reports that she has continued drinking fluids regularly but has not had much of an appetite today.  Normal BM this am.  Denies any dysuria, hematuria, or vaginal discharge.  No new sexual partners.  Hx of ectopic pregnancy in 2012.  Pt is currently on her menstrual cycle and denies possibility of pregnancy at this time.  The history is provided by the patient.    Past Medical History  Diagnosis Date  . Ectopic pregnancy     Past Surgical History  Procedure Laterality Date  . Salpingectomy    . Laparoscopy  08/28/2011    Procedure: LAPAROSCOPY OPERATIVE;  Surgeon: Lesly Dukes, MD;  Location: [facility];  Service: Gynecology;  Laterality: Right;  Ectopic    Family History  Problem Relation Age of Onset  . Hypertension Father   . Heart disease Father     History  Substance Use Topics  . Smoking status: Current Some Day Smoker -- 0.25 packs/day    Types: Cigarettes  . Smokeless tobacco: Not on file  . Alcohol Use: 0.0 oz/week    0 drink(s) per week     Comment: occasional alcohol    OB History   Grav Para Term Preterm Abortions TAB SAB Ect Mult Living   3    2   2         Review of Systems  Gastrointestinal: Positive for nausea, vomiting, abdominal pain and diarrhea.  All other systems reviewed and are negative.    Allergies  Review of patient's allergies indicates no known allergies.  Home Medications  No current outpatient prescriptions on file.  BP 137/91  Pulse 88  Temp(Src)  97.8 F (36.6 C) (Oral)  Resp 18  SpO2 100%  LMP 01/02/2013  Physical Exam  Nursing note and vitals reviewed. Constitutional: She is oriented to person, place, and time. She appears well-developed and well-nourished.  HENT:  Head: Normocephalic and atraumatic.  Mouth/Throat: Oropharynx is clear and moist.  Eyes: Conjunctivae and EOM are normal. Pupils are equal, round, and reactive to light.  Neck: Normal range of motion. Neck supple.  Cardiovascular: Normal rate, regular rhythm and normal heart sounds.   Pulmonary/Chest: Effort normal and breath sounds normal.  Abdominal: Soft. Bowel sounds are normal. There is generalized tenderness. There is no CVA tenderness.  Genitourinary: Vagina normal. There is no lesion on the right labia. There is no lesion on the left labia. Cervix exhibits no motion tenderness and no discharge. Right adnexum displays no tenderness. Left adnexum displays no tenderness. No tenderness around the vagina. No vaginal discharge found.  Musculoskeletal: Normal range of motion.  Lymphadenopathy:    She has no cervical adenopathy.  Neurological: She is alert and oriented to person, place, and time.  Skin: Skin is warm and dry.  Psychiatric: She has a normal mood and affect.    ED Course  Procedures (including critical care time)  Labs Reviewed  COMPREHENSIVE METABOLIC PANEL - Abnormal; Notable for the following:    Potassium 3.4 (*)    Glucose, Bld 111 (*)  All other components within normal limits  URINALYSIS, ROUTINE W REFLEX MICROSCOPIC - Abnormal; Notable for the following:    Hgb urine dipstick MODERATE (*)    Urobilinogen, UA 2.0 (*)    All other components within normal limits  URINE MICROSCOPIC-ADD ON - Abnormal; Notable for the following:    Bacteria, UA FEW (*)    All other components within normal limits  WET PREP, GENITAL  GC/CHLAMYDIA PROBE AMP  URINE CULTURE  CBC WITH DIFFERENTIAL  LIPASE, BLOOD  POCT PREGNANCY, URINE   No results  found.   1. Abdominal  pain, other specified site   2. Nausea       MDM   Pt presents to the ED for diffuse, cramping abdominal pain and nausea since yesterday.  Diarrhea and vomiting resolved upon arrival to the ED.  Pt afebrile, non-toxic appearing, VS stable.  Labs largely unremarkable.  U/a with moderate blood but pt is on her menstrual cycle at this time- urine culture pending.  Good resolution of sx with IVF, morphine, and zofran.  Tolerating PO fluids prior to d/c.  Rx zofran and tramadol.  FU with PCP or urgent care if sx not improving in the next few days.  Discussed with Dr. Anitra Lauth who agrees with plan.  Return precautions advised.        Garlon Hatchet, PA-C 01/02/13 2142

## 2013-01-02 NOTE — ED Notes (Signed)
Patient reports that she had N/V/D yesterday and today is having abdominal cramping and sharp pain of the mid abdomen.

## 2013-01-03 LAB — URINE CULTURE: Culture: NO GROWTH

## 2013-01-05 NOTE — ED Provider Notes (Signed)
Medical screening examination/treatment/procedure(s) were performed by non-physician practitioner and as supervising physician I was immediately available for consultation/collaboration.   Gwyneth Sprout, MD 01/05/13 [phone]

## 2014-04-14 ENCOUNTER — Encounter (HOSPITAL_COMMUNITY): Payer: Self-pay

## 2014-04-14 ENCOUNTER — Inpatient Hospital Stay (HOSPITAL_COMMUNITY)
Admission: AD | Admit: 2014-04-14 | Discharge: 2014-04-14 | Disposition: A | Discharge status: Home / Self Care | Payer: BC Managed Care – PPO | Source: Ambulatory Visit | Attending: Obstetrics & Gynecology | Admitting: Obstetrics & Gynecology

## 2014-04-14 DIAGNOSIS — IMO0002 Reserved for concepts with insufficient information to code with codable children: Secondary | ICD-10-CM | POA: Insufficient documentation

## 2014-04-14 DIAGNOSIS — R109 Unspecified abdominal pain: Secondary | ICD-10-CM | POA: Insufficient documentation

## 2014-04-14 DIAGNOSIS — A499 Bacterial infection, unspecified: Secondary | ICD-10-CM | POA: Insufficient documentation

## 2014-04-14 DIAGNOSIS — F17200 Nicotine dependence, unspecified, uncomplicated: Secondary | ICD-10-CM | POA: Insufficient documentation

## 2014-04-14 DIAGNOSIS — B9689 Other specified bacterial agents as the cause of diseases classified elsewhere: Secondary | ICD-10-CM | POA: Insufficient documentation

## 2014-04-14 DIAGNOSIS — N760 Acute vaginitis: Secondary | ICD-10-CM | POA: Insufficient documentation

## 2014-04-14 LAB — URINALYSIS, ROUTINE W REFLEX MICROSCOPIC
BILIRUBIN URINE: NEGATIVE
Glucose, UA: NEGATIVE mg/dL
KETONES UR: NEGATIVE mg/dL
LEUKOCYTES UA: NEGATIVE
NITRITE: NEGATIVE
PROTEIN: NEGATIVE mg/dL
Specific Gravity, Urine: <1.005 — ABNORMAL LOW (ref 1.005–1.030)
Urobilinogen, UA: 0.2 mg/dL (ref 0.0–1.0)
pH: 6.5 (ref 5.0–8.0)

## 2014-04-14 LAB — POCT PREGNANCY, URINE: Preg Test, Ur: NEGATIVE

## 2014-04-14 LAB — URINE MICROSCOPIC-ADD ON

## 2014-04-14 LAB — WET PREP, GENITAL
Trich, Wet Prep: NONE SEEN
Yeast Wet Prep HPF POC: NONE SEEN

## 2014-04-14 MED ORDER — METRONIDAZOLE 500 MG PO TABS: 500.0000 mg | ORAL_TABLET | Freq: Two times a day (BID) | ORAL | Status: DC

## 2014-04-14 NOTE — MAU Provider Note (Signed)
History     CSN: [id number]  Arrival date and time: 04/14/14 1659   First Provider Initiated Contact with Patient 04/14/14 1734      Chief Complaint  Patient presents with  . Vaginal Discharge   HPI Ms. Cathy Sweeney is a 28 y.o. G3P0030 who presents to MAU today with complaint of thick, white vaginal discharge with mild odor. She is having occasional associated itching. She denies rash, vaginal bleeding, N/V/D or UTI symptoms today. She states occasional mild lower abdominal cramping x 1 week. She states LMP of 04/07/14. She has also had dyspareunia x 1 week. She does not use condoms. She has bilateral salpingectomy for previous ectopic pregnancies.   OB History   Grav Para Term Preterm Abortions TAB SAB Ect Mult Living   3    3   3         Past Medical History  Diagnosis Date  . Ectopic pregnancy     Past Surgical History  Procedure Laterality Date  . Salpingectomy    . Laparoscopy  08/28/2011    Procedure: LAPAROSCOPY OPERATIVE;  Surgeon: Lesly DukesKelly H. Leggett, MD;  Location: [facility];  Service: Gynecology;  Laterality: Right;  Ectopic    Family History  Problem Relation Age of Onset  . Hypertension Father   . Heart disease Father     History  Substance Use Topics  . Smoking status: Current Some Day Smoker -- 0.25 packs/day    Types: Cigarettes  . Smokeless tobacco: Not on file  . Alcohol Use: 0.0 oz/week    0 drink(s) per week     Comment: occasional alcohol    Allergies: No Known Allergies  Prescriptions prior to admission  Medication Sig Dispense Refill  . diphenhydrAMINE (BENADRYL) 25 MG tablet Take 50 mg by mouth at bedtime as needed for itching or sleep.        Review of Systems  Constitutional: Negative for fever and malaise/fatigue.  Gastrointestinal: Positive for abdominal pain. Negative for nausea, vomiting, diarrhea and constipation.  Genitourinary: Negative for dysuria, urgency and frequency.       Neg - vaginal bleeding + vaginal discharge    Physical Exam   Blood pressure 123/77, pulse 98, temperature 98.3 F (36.8 C), temperature source Oral, resp. rate 18, height 5' 5.5" (1.664 m), weight 165 lb (74.844 kg), last menstrual period 04/07/2014.  Physical Exam  Constitutional: She is oriented to person, place, and time. She appears well-developed and well-nourished. No distress.  HENT:  Head: Normocephalic and atraumatic.  Cardiovascular: Normal rate.   Respiratory: Effort normal.  GI: Soft. She exhibits no distension and no mass. There is no tenderness. There is no rebound and no guarding.  Genitourinary: Uterus is not enlarged and not tender. Cervix exhibits no motion tenderness, no discharge and no friability. Right adnexum displays no mass and no tenderness. Left adnexum displays no mass and no tenderness. No bleeding around the vagina. Vaginal discharge (small amount of thin, white-yellow, malodorous vaginal discharge noted) found.  Neurological: She is alert and oriented to person, place, and time.  Skin: Skin is warm and dry. No erythema.  Psychiatric: She has a normal mood and affect.   Results for orders placed during the hospital encounter of 04/14/14 (from the past 24 hour(s))  URINALYSIS, ROUTINE W REFLEX MICROSCOPIC     Status: Abnormal   Collection Time    04/14/14  5:15 PM      Result Value Ref Range   Color, Urine YELLOW  YELLOW  APPearance CLEAR  CLEAR   Specific Gravity, Urine <1.005 (*) 1.005 - 1.030   pH 6.5  5.0 - 8.0   Glucose, UA NEGATIVE  NEGATIVE mg/dL   Hgb urine dipstick SMALL (*) NEGATIVE   Bilirubin Urine NEGATIVE  NEGATIVE   Ketones, ur NEGATIVE  NEGATIVE mg/dL   Protein, ur NEGATIVE  NEGATIVE mg/dL   Urobilinogen, UA 0.2  0.0 - 1.0 mg/dL   Nitrite NEGATIVE  NEGATIVE   Leukocytes, UA NEGATIVE  NEGATIVE  URINE MICROSCOPIC-ADD ON     Status: None   Collection Time    04/14/14  5:15 PM      Result Value Ref Range   Squamous Epithelial / LPF RARE  RARE   RBC / HPF 0-2  <3 RBC/hpf    Bacteria, UA RARE  RARE  POCT PREGNANCY, URINE     Status: None   Collection Time    04/14/14  5:24 PM      Result Value Ref Range   Preg Test, Ur NEGATIVE  NEGATIVE  WET PREP, GENITAL     Status: Abnormal   Collection Time    04/14/14  5:58 PM      Result Value Ref Range   Yeast Wet Prep HPF POC NONE SEEN  NONE SEEN   Trich, Wet Prep NONE SEEN  NONE SEEN   Clue Cells Wet Prep HPF POC FEW (*) NONE SEEN   WBC, Wet Prep HPF POC MODERATE (*) NONE SEEN    MAU Course  Procedures None  MDM UPT - negative UA, wet prep and GC/Chlamydia today  Assessment and Plan  A: Bacterial vaginosis  P: Discharge home Rx for Flagyl sent to patient's pharmacy Discussed hygiene products to avoid recurrence Patient may return to MAU as needed or if her condition were to change or worsen   Freddi Starr, PA-C  04/14/2014, 6:26 PM

## 2014-04-14 NOTE — Discharge Instructions (Signed)
Bacterial Vaginosis Bacterial vaginosis is a vaginal infection that occurs when the normal balance of bacteria in the vagina is disrupted. It results from an overgrowth of certain bacteria. This is the most common vaginal infection in women of childbearing age. Treatment is important to prevent complications, especially in pregnant women, as it can cause a premature delivery. CAUSES  Bacterial vaginosis is caused by an increase in harmful bacteria that are normally present in smaller amounts in the vagina. Several different kinds of bacteria can cause bacterial vaginosis. However, the reason that the condition develops is not fully understood. RISK FACTORS Certain activities or behaviors can put you at an increased risk of developing bacterial vaginosis, including:  Having a new sex partner or multiple sex partners.  Douching.  Using an intrauterine device (IUD) for contraception. Women do not get bacterial vaginosis from toilet seats, bedding, swimming pools, or contact with objects around them. SIGNS AND SYMPTOMS  Some women with bacterial vaginosis have no signs or symptoms. Common symptoms include:  Grey vaginal discharge.  A fishlike odor with discharge, especially after sexual intercourse.  Itching or burning of the vagina and vulva.  Burning or pain with urination. DIAGNOSIS  Your health care provider will take a medical history and examine the vagina for signs of bacterial vaginosis. A sample of vaginal fluid may be taken. Your health care provider will look at this sample under a microscope to check for bacteria and abnormal cells. A vaginal pH test may also be done.  TREATMENT  Bacterial vaginosis may be treated with antibiotic medicines. These may be given in the form of a pill or a vaginal cream. A second round of antibiotics may be prescribed if the condition comes back after treatment.  HOME CARE INSTRUCTIONS   Only take over-the-counter or prescription medicines as  directed by your health care provider.  If antibiotic medicine was prescribed, take it as directed. Make sure you finish it even if you start to feel better.  Do not have sex until treatment is completed.  Tell all sexual partners that you have a vaginal infection. They should see their health care provider and be treated if they have problems, such as a mild rash or itching.  Practice safe sex by using condoms and only having one sex partner. SEEK MEDICAL CARE IF:   Your symptoms are not improving after 3 days of treatment.  You have increased discharge or pain.  You have a fever. MAKE SURE YOU:   Understand these instructions.  Will watch your condition.  Will get help right away if you are not doing well or get worse. FOR MORE INFORMATION  Centers for Disease Control and Prevention, Division of STD Prevention: www.cdc.gov/std American Sexual Health Association (ASHA): www.ashastd.org  Document Released: 09/11/2005 Document Revised: 07/02/2013 Document Reviewed: 04/23/2013 Exit[employer] Patient Information 2015 [employer]. This information is not intended to replace advice given to you by your health care provider. Make sure you discuss any questions you have with your health care provider.  

## 2014-04-14 NOTE — MAU Note (Signed)
Been having some vaginal irritation, a thick discharge and painful intercourse.  Noted on Mon.

## 2014-04-15 LAB — GC/CHLAMYDIA PROBE AMP
CT PROBE, AMP APTIMA: NEGATIVE
GC PROBE AMP APTIMA: NEGATIVE

## 2014-07-27 ENCOUNTER — Encounter (HOSPITAL_COMMUNITY): Payer: Self-pay

## 2015-05-21 ENCOUNTER — Encounter (HOSPITAL_COMMUNITY): Payer: Self-pay | Admitting: *Deleted

## 2015-05-21 ENCOUNTER — Inpatient Hospital Stay (HOSPITAL_COMMUNITY)
Admission: AD | Admit: 2015-05-21 | Discharge: 2015-05-21 | Disposition: A | Discharge status: Home / Self Care | Payer: Self-pay | Source: Ambulatory Visit | Attending: Obstetrics and Gynecology | Admitting: Obstetrics and Gynecology

## 2015-05-21 DIAGNOSIS — F17210 Nicotine dependence, cigarettes, uncomplicated: Secondary | ICD-10-CM | POA: Insufficient documentation

## 2015-05-21 DIAGNOSIS — B373 Candidiasis of vulva and vagina: Secondary | ICD-10-CM | POA: Insufficient documentation

## 2015-05-21 DIAGNOSIS — B3731 Acute candidiasis of vulva and vagina: Secondary | ICD-10-CM

## 2015-05-21 LAB — URINE MICROSCOPIC-ADD ON

## 2015-05-21 LAB — WET PREP, GENITAL
Clue Cells Wet Prep HPF POC: NONE SEEN
TRICH WET PREP: NONE SEEN
WBC, Wet Prep HPF POC: NONE SEEN
Yeast Wet Prep HPF POC: NONE SEEN

## 2015-05-21 LAB — URINALYSIS, ROUTINE W REFLEX MICROSCOPIC
Bilirubin Urine: NEGATIVE
GLUCOSE, UA: NEGATIVE mg/dL
KETONES UR: NEGATIVE mg/dL
Leukocytes, UA: NEGATIVE
Nitrite: NEGATIVE
PROTEIN: NEGATIVE mg/dL
Specific Gravity, Urine: 1.02 (ref 1.005–1.030)
Urobilinogen, UA: 0.2 mg/dL (ref 0.0–1.0)
pH: 8 (ref 5.0–8.0)

## 2015-05-21 LAB — POCT PREGNANCY, URINE: Preg Test, Ur: NEGATIVE

## 2015-05-21 MED ORDER — FLUCONAZOLE 150 MG PO TABS
150.0000 mg | ORAL_TABLET | Freq: Once | ORAL | Status: AC
  Administered 2015-05-21: 150 mg via ORAL
  Filled 2015-05-21: qty 1

## 2015-05-21 NOTE — MAU Provider Note (Signed)
History     CSN: [id number]  Arrival date and time: 05/21/15 1850   First Provider Initiated Contact with Patient 05/21/15 1949      Chief Complaint  Patient presents with  . Vaginal Irritation    HPI Ms. Cathy Sweeney is a 29 y.o. G3P0030 who presents to MAU today with complaint of vaginal irritation. The patient states that she had a thick, white discharge with associated itching. She started her period yesterday so she no longer notices the discharge, but the itching and irritation has continued. She denies rash, fever, pelvic pain or UTI symptoms.   OB History    Gravida Para Term Preterm AB TAB SAB Ectopic Multiple Living   3    3 0  3        Past Medical History  Diagnosis Date  . Ectopic pregnancy     Past Surgical History  Procedure Laterality Date  . Salpingectomy    . Laparoscopy  08/28/2011    Procedure: LAPAROSCOPY OPERATIVE;  Surgeon: Lesly Dukes, MD;  Location: [facility];  Service: Gynecology;  Laterality: Right;  Ectopic    Family History  Problem Relation Age of Onset  . Hypertension Father   . Heart disease Father     Social History  Substance Use Topics  . Smoking status: Current Some Day Smoker -- 0.25 packs/day    Types: Cigarettes  . Smokeless tobacco: None  . Alcohol Use: 0.0 oz/week    0 Standard drinks or equivalent per week     Comment: occasional alcohol    Allergies: No Known Allergies  Prescriptions prior to admission  Medication Sig Dispense Refill Last Dose  . ibuprofen (ADVIL,MOTRIN) 200 MG tablet Take 800 mg by mouth every 6 (six) hours as needed for moderate pain.   05/20/2015 at Unknown time    Review of Systems  Constitutional: Negative for fever and malaise/fatigue.  Gastrointestinal: Negative for nausea, vomiting, abdominal pain, diarrhea and constipation.  Genitourinary: Negative for dysuria, urgency and frequency.       + vaginal bleeding, discharge   Physical Exam   Blood pressure 109/78, pulse 56,  temperature 98.3 F (36.8 C), temperature source Oral, resp. rate 16, height 5\' 7"  (1.702 m), weight 160 lb (72.576 kg), last menstrual period 05/20/2015.  Physical Exam  Nursing note and vitals reviewed. Constitutional: She is oriented to person, place, and time. She appears well-developed and well-nourished. No distress.  HENT:  Head: Normocephalic and atraumatic.  Cardiovascular: Normal rate.   Respiratory: Effort normal.  GI: Soft. She exhibits no distension and no mass. There is no tenderness. There is no rebound and no guarding.  Genitourinary: Uterus is not enlarged and not tender. Cervix exhibits no motion tenderness, no discharge and no friability. Right adnexum displays no mass and no tenderness. Left adnexum displays no mass and no tenderness. There is bleeding (small blood noted in the vaginal vault) in the vagina. No vaginal discharge found.  Neurological: She is alert and oriented to person, place, and time.  Skin: Skin is warm and dry. No erythema.  Psychiatric: She has a normal mood and affect.   Results for orders placed or performed during the hospital encounter of 05/21/15 (from the past 24 hour(s))  Urinalysis, Routine w reflex microscopic (not at [facility])     Status: Abnormal   Collection Time: 05/21/15  7:13 PM  Result Value Ref Range   Color, Urine YELLOW YELLOW   APPearance CLEAR CLEAR   Specific Gravity, Urine 1.020  1.005 - 1.030   pH 8.0 5.0 - 8.0   Glucose, UA NEGATIVE NEGATIVE mg/dL   Hgb urine dipstick TRACE (A) NEGATIVE   Bilirubin Urine NEGATIVE NEGATIVE   Ketones, ur NEGATIVE NEGATIVE mg/dL   Protein, ur NEGATIVE NEGATIVE mg/dL   Urobilinogen, UA 0.2 0.0 - 1.0 mg/dL   Nitrite NEGATIVE NEGATIVE   Leukocytes, UA NEGATIVE NEGATIVE  Urine microscopic-add on     Status: None   Collection Time: 05/21/15  7:13 PM  Result Value Ref Range   Squamous Epithelial / LPF RARE RARE   WBC, UA 0-2 <3 WBC/hpf   RBC / HPF 0-2 <3 RBC/hpf   Bacteria, UA RARE RARE   Pregnancy, urine POC     Status: None   Collection Time: 05/21/15  7:27 PM  Result Value Ref Range   Preg Test, Ur NEGATIVE NEGATIVE  Wet prep, genital     Status: None   Collection Time: 05/21/15  7:53 PM  Result Value Ref Range   Yeast Wet Prep HPF POC NONE SEEN NONE SEEN   Trich, Wet Prep NONE SEEN NONE SEEN   Clue Cells Wet Prep HPF POC NONE SEEN NONE SEEN   WBC, Wet Prep HPF POC NONE SEEN NONE SEEN    MAU Course  Procedures None  MDM Wet prep, GC/Chlamydia, HIV and RPR today 150 mg Diflucan given in MAU for clinical yeast  Assessment and Plan  A: Yeast vulvovaginitis, clinical  P: Discharge home Patient treated in MAU with Diflucan  Patient advised to follow-up with PCP of choice for routine health maintenance Patient may return to MAU as needed or if her condition were to change or worsen   Marny Lowenstein, PA-C  05/21/2015, 8:17 PM

## 2015-05-21 NOTE — MAU Note (Signed)
Patient presents stating that she is not pregnant and started her menstrual cycle yesterday but has noticed vaginal irritation and discharge X 2-3 days.

## 2015-05-21 NOTE — Discharge Instructions (Signed)

## 2015-05-22 LAB — RPR: RPR: NONREACTIVE

## 2015-05-22 LAB — HIV ANTIBODY (ROUTINE TESTING W REFLEX): HIV SCREEN 4TH GENERATION: NONREACTIVE

## 2015-05-24 LAB — GC/CHLAMYDIA PROBE AMP (~~LOC~~) NOT AT ARMC
Chlamydia: NEGATIVE
NEISSERIA GONORRHEA: NEGATIVE

## 2018-04-16 ENCOUNTER — Emergency Department (HOSPITAL_COMMUNITY)
Admission: EM | Admit: 2018-04-16 | Discharge: 2018-04-16 | Disposition: A | Discharge status: Home / Self Care | Payer: BC Managed Care – PPO | Attending: Emergency Medicine | Admitting: Emergency Medicine

## 2018-04-16 ENCOUNTER — Other Ambulatory Visit: Payer: Self-pay

## 2018-04-16 ENCOUNTER — Encounter (HOSPITAL_COMMUNITY): Payer: Self-pay | Admitting: Emergency Medicine

## 2018-04-16 DIAGNOSIS — F17210 Nicotine dependence, cigarettes, uncomplicated: Secondary | ICD-10-CM | POA: Diagnosis not present

## 2018-04-16 DIAGNOSIS — K0889 Other specified disorders of teeth and supporting structures: Secondary | ICD-10-CM | POA: Insufficient documentation

## 2018-04-16 MED ORDER — PENICILLIN V POTASSIUM 500 MG PO TABS: 500.0000 mg | ORAL_TABLET | Freq: Four times a day (QID) | ORAL | 0 refills | Status: AC

## 2018-04-16 NOTE — ED Triage Notes (Signed)
Pt reports 2 day hx of pain in l/upper side of mouth. Pt stated that she has a broken wisdom tooth.

## 2018-04-16 NOTE — Discharge Instructions (Addendum)
You may alternate taking Tylenol and Ibuprofen as needed for pain control. You may take 400-600 mg of ibuprofen every 6 hours and [phone] mg of Tylenol every 6 hours. Do not exceed 4000 mg of Tylenol daily as this can lead to liver damage. Also, make sure to take Ibuprofen with meals as it can cause an upset stomach. Do not take other NSAIDs while taking Ibuprofen such as (Aleve, Naprosyn, Aspirin, Celebrex, etc) and do not take more than the prescribed dose as this can lead to ulcers and bleeding in your GI tract.  You were given a prescription for antibiotics. Please take the antibiotic prescription fully.   Please follow up with a dentist within the next 7-10 days for re-evaluation and further treatment of your symptoms.   Please return to the ER sooner if you have any new or worsening symptoms.

## 2018-04-16 NOTE — ED Provider Notes (Signed)
[facility]-EMERGENCY DEPT Provider Note   CSN: [id number] Arrival date & time: 04/16/18  1430     History   Chief Complaint Chief Complaint  Patient presents with  . Dental Pain    HPI Cathy Sweeney is a 32 y.o. female.  HPI   Pt is a 32 y/o female presents emergency department today complaining of left upper dental pain that has been intermittent for the last year but seem to worsen about 3 days ago.  States that he has a fractured tooth to the left upper molar.  Denies any URI symptoms or fevers.  No difficulty swallowing.  No swelling to the face or neck.  Has tried Motrin and Orajel without significant relief.  States that she has seen a dentist and was was to have a tooth pulled in the past but she did not follow-up due to financial reasons.  Past Medical History:  Diagnosis Date  . Ectopic pregnancy     Patient Active Problem List   Diagnosis Date Noted  . Ectopic pregnancy 09/11/2011  . Postop check 09/11/2011    Past Surgical History:  Procedure Laterality Date  . LAPAROSCOPY  08/28/2011   Procedure: LAPAROSCOPY OPERATIVE;  Surgeon: Lesly Dukes, MD;  Location: [facility];  Service: Gynecology;  Laterality: Right;  Ectopic  . SALPINGECTOMY       OB History    Gravida  3   Para      Term      Preterm      AB  3   Living        SAB      TAB  0   Ectopic  3   Multiple      Live Births               Home Medications    Prior to Admission medications   Medication Sig Start Date End Date Taking? Authorizing Provider  penicillin v potassium (VEETID) 500 MG tablet Take 1 tablet (500 mg total) by mouth 4 (four) times daily for 7 days. 04/16/18 04/23/18  Rafaelita Foister S, PA-C    Family History Family History  Problem Relation Age of Onset  . Hypertension Father   . Heart disease Father     Social History Social History   Tobacco Use  . Smoking status: Current Some Day Smoker    Packs/day: 0.25    Types:  Cigarettes  Substance Use Topics  . Alcohol use: Yes    Alcohol/week: 0.0 oz    Comment: occasional alcohol  . Drug use: No     Allergies   Patient has no known allergies.   Review of Systems Review of Systems  Constitutional: Negative for fever.  HENT: Positive for dental problem. Negative for congestion, rhinorrhea and sore throat.   Skin: Negative for rash.     Physical Exam Updated Vital Signs BP 116/82 (BP Location: Right Arm)   Pulse (!) 113   Temp 98.2 F (36.8 C) (Oral)   Resp 16   Ht 5\' 7"  (1.702 m)   Wt 77.1 kg (170 lb)   LMP 04/14/2018 (Exact Date)   SpO2 100%   BMI 26.63 kg/m   Physical Exam  Constitutional: She is oriented to person, place, and time. She appears well-developed and well-nourished. No distress.  HENT:  Head: Normocephalic and atraumatic.  Multiple missing teeth.  Multiple dental caries.  Left upper posterior molar is fractured and tender to percussion.  No dental abscess  noted on exam.  No swelling to the face or neck.  No pharyngeal erythema or tonsillar swelling.  Uvula midline.  Tolerating secretions.  Eyes: Pupils are equal, round, and reactive to light. Conjunctivae are normal.  Neck: Neck supple.  Cardiovascular: Normal rate and regular rhythm.  Pulmonary/Chest: Effort normal and breath sounds normal. She has no wheezes.  Musculoskeletal: Normal range of motion.  Lymphadenopathy:    She has no cervical adenopathy.  Neurological: She is alert and oriented to person, place, and time. No cranial nerve deficit.  Skin: Skin is warm and dry. Capillary refill takes less than 2 seconds.  Psychiatric: She has a normal mood and affect.  Nursing note and vitals reviewed.  ED Treatments / Results  Labs (all labs ordered are listed, but only abnormal results are displayed) Labs Reviewed - No data to display  EKG None  Radiology No results found.  Procedures Procedures (including critical care time)  Medications Ordered in  ED Medications - No data to display   Initial Impression / Assessment and Plan / ED Course  I have reviewed the triage vital signs and the nursing notes.  Pertinent labs & imaging results that were available during my care of the patient were reviewed by me and considered in my medical decision making (see chart for details).     Final Clinical Impressions(s) / ED Diagnoses   Final diagnoses:  Pain, dental   Patient with toothache.  No gross abscess.  Exam unconcerning for Ludwig's angina or spread of infection.  Will treat with penicillin and pain medicine.  Urged patient to follow-up with dentist.    ED Discharge Orders        Ordered    penicillin v potassium (VEETID) 500 MG tablet  4 times daily     04/16/18 1613       Karrie MeresCouture, Bradford Cazier S, PA-C 04/16/18 1614    Pricilla LovelessGoldston, Scott, MD 04/17/18 1742

## 2018-05-01 ENCOUNTER — Emergency Department (HOSPITAL_COMMUNITY)
Admission: EM | Admit: 2018-05-01 | Discharge: 2018-05-02 | Disposition: A | Discharge status: Home / Self Care | Payer: BC Managed Care – PPO | Attending: Emergency Medicine | Admitting: Emergency Medicine

## 2018-05-01 ENCOUNTER — Encounter (HOSPITAL_COMMUNITY): Payer: Self-pay

## 2018-05-01 ENCOUNTER — Other Ambulatory Visit: Payer: Self-pay

## 2018-05-01 DIAGNOSIS — K0889 Other specified disorders of teeth and supporting structures: Secondary | ICD-10-CM | POA: Insufficient documentation

## 2018-05-01 DIAGNOSIS — F17210 Nicotine dependence, cigarettes, uncomplicated: Secondary | ICD-10-CM | POA: Insufficient documentation

## 2018-05-01 DIAGNOSIS — N760 Acute vaginitis: Secondary | ICD-10-CM | POA: Insufficient documentation

## 2018-05-01 DIAGNOSIS — B9689 Other specified bacterial agents as the cause of diseases classified elsewhere: Secondary | ICD-10-CM

## 2018-05-01 LAB — WET PREP, GENITAL
Sperm: NONE SEEN
TRICH WET PREP: NONE SEEN
Yeast Wet Prep HPF POC: NONE SEEN

## 2018-05-01 LAB — URINALYSIS, ROUTINE W REFLEX MICROSCOPIC
BACTERIA UA: NONE SEEN
BILIRUBIN URINE: NEGATIVE
GLUCOSE, UA: NEGATIVE mg/dL
KETONES UR: NEGATIVE mg/dL
LEUKOCYTES UA: NEGATIVE
NITRITE: NEGATIVE
PROTEIN: NEGATIVE mg/dL
Specific Gravity, Urine: 1.008 (ref 1.005–1.030)
pH: 7 (ref 5.0–8.0)

## 2018-05-01 LAB — PREGNANCY, URINE: PREG TEST UR: NEGATIVE

## 2018-05-01 MED ORDER — CLINDAMYCIN HCL 300 MG PO CAPS: 300.0000 mg | ORAL_CAPSULE | Freq: Four times a day (QID) | ORAL | 0 refills | Status: DC

## 2018-05-01 NOTE — ED Provider Notes (Signed)
[facility]-EMERGENCY DEPT Provider Note   CSN: [id number] Arrival date & time: 05/01/18  1753     History   Chief Complaint Chief Complaint  Patient presents with  . Dental Pain    HPI Cathy Sweeney is a 32 y.o. female.  Cathy Sweeney is a 32 y.o. Female who presents emergency department for evaluation of left upper dental pain as well as vaginal discharge.  Patient reports she was seen about 2 weeks and treated with antibiotics, she for the dental surgery she needs yet, pain has improved over the past 2 to 3 days.  She reports a constant dull throbbing ache over the right upper posterior molars, and she has noticed no swelling of the gums surrounding this area, no drainage from the area.  Patient reports he has been told to elevate she denies any difficulty swallowing or breathing, no swelling or pain under the tongue, no difficulty moving the eye, no fevers or chills.  Patient has been using ibuprofen and Tylenol.  Patient reports over the past 3 days she is also noticed increased vaginal discharge, she describes it as a thick white discharge with odor.  She reports no associated pelvic pain or abdominal pain, no nausea or vomiting, no burning or discomfort with urination.  She reports she is sexually active and partner for over a year.  Last menstrual period was 03/29/2018, no prior vaginal bleeding.  History of BV in the past.      Past Medical History:  Diagnosis Date  . Ectopic pregnancy     Patient Active Problem List   Diagnosis Date Noted  . Ectopic pregnancy 09/11/2011  . Postop check 09/11/2011    Past Surgical History:  Procedure Laterality Date  . LAPAROSCOPY  08/28/2011   Procedure: LAPAROSCOPY OPERATIVE;  Surgeon: Lesly Dukes, MD;  Location: [facility];  Service: Gynecology;  Laterality: Right;  Ectopic  . SALPINGECTOMY       OB History    Gravida  3   Para      Term      Preterm      AB  3   Living        SAB      TAB    0   Ectopic  3   Multiple      Live Births               Home Medications    Prior to Admission medications   Medication Sig Start Date End Date Taking? Authorizing Provider  clindamycin (CLEOCIN) 300 MG capsule Take 1 capsule (300 mg total) by mouth 4 (four) times daily. X 7 days 05/01/18   Dartha Lodge, PA-C    Family History Family History  Problem Relation Age of Onset  . Hypertension Father   . Heart disease Father     Social History Social History   Tobacco Use  . Smoking status: Current Some Day Smoker    Packs/day: 0.25    Types: Cigarettes  . Smokeless tobacco: Never Used  Substance Use Topics  . Alcohol use: Yes    Alcohol/week: 0.0 standard drinks    Comment: occasional alcohol  . Drug use: No     Allergies   Patient has no known allergies.   Review of Systems Review of Systems  Constitutional: Negative for chills and fever.  HENT: Positive for dental problem. Negative for facial swelling and trouble swallowing.   Eyes: Negative for visual disturbance.  Respiratory:  Negative for shortness of breath.   Cardiovascular: Negative for chest pain.  Gastrointestinal: Negative for abdominal pain, nausea and vomiting.  Genitourinary: Positive for vaginal discharge. Negative for difficulty urinating, dysuria, frequency, pelvic pain, vaginal bleeding and vaginal pain.  Musculoskeletal: Negative for arthralgias, back pain and myalgias.  Skin: Negative for color change and rash.  Neurological: Negative for dizziness, syncope and light-headedness.     Physical Exam Updated Vital Signs BP (!) 112/95   Pulse 64   Temp 98.5 F (36.9 C) (Oral)   Resp 14   Ht 5\' 7"  (1.702 m)   Wt 77.1 kg   LMP 04/14/2018 (Exact Date)   SpO2 100%   BMI 26.63 kg/m   Physical Exam  Constitutional: She appears well-developed and well-nourished. No distress.  HENT:  Head: Normocephalic and atraumatic.  Tenderness to palpation over the left upper posterior molars  with surrounding erythema, no obvious drainable abscess.  Posterior oropharynx, no sublingual swelling.  Tolerating secretions without difficulty, normal phonation.  No facial swelling.  Eyes: Pupils are equal, round, and reactive to light. EOM are normal. Right eye exhibits no discharge. Left eye exhibits no discharge.  Neck: Normal range of motion. Neck supple.  Cardiovascular: Normal rate, regular rhythm, normal heart sounds and intact distal pulses.  Pulmonary/Chest: Effort normal and breath sounds normal. No respiratory distress.  Respirations equal and unlabored, patient able to speak in full sentences, lungs clear to auscultation bilaterally  Abdominal: Soft. Bowel sounds are normal. She exhibits no distension and no mass. There is no tenderness. There is no guarding.  Abdomen soft, nondistended, nontender to palpation in all quadrants without guarding or peritoneal signs  Genitourinary:  Genitourinary Comments: Chaperone present during pelvic exam. No external genital lesions noted. Pelvic exam reveals small amount of white discharge in the vaginal vault and coming from the cervical office, no evidence of cervicitis.  On bimanual exam there is no cervical motion tenderness, bilateral adnexa are nontender to palpation without masses.  Neurological: She is alert. Coordination normal.  Skin: Skin is warm and dry. Capillary refill takes less than 2 seconds. She is not diaphoretic.  Psychiatric: She has a normal mood and affect. Her behavior is normal.  Nursing note and vitals reviewed.    ED Treatments / Results  Labs (all labs ordered are listed, but only abnormal results are displayed) Labs Reviewed  WET PREP, GENITAL - Abnormal; Notable for the following components:      Result Value   Clue Cells Wet Prep HPF POC PRESENT (*)    WBC, Wet Prep HPF POC FEW (*)    All other components within normal limits  URINALYSIS, ROUTINE W REFLEX MICROSCOPIC - Abnormal; Notable for the following  components:   Hgb urine dipstick SMALL (*)    All other components within normal limits  PREGNANCY, URINE  RPR  HIV ANTIBODY (ROUTINE TESTING)  GC/CHLAMYDIA PROBE AMP ([place]) NOT AT [facility]    EKG None  Radiology No results found.  Procedures Procedures (including critical care time)  Medications Ordered in ED Medications - No data to display   Initial Impression / Assessment and Plan / ED Course  I have reviewed the triage vital signs and the nursing notes.  Pertinent labs & imaging results that were available during my care of the patient were reviewed by me and considered in my medical decision making (see chart for details).  Patient presents for evaluation of dental pain and white vaginal discharge.   No gross abscess.  Exam unconcerning for Ludwig's angina or spread of infection.  Abdominal exam is benign.  Pelvic exam revealed white discharge, wet, few WBC and patient has not had a partner so feel that GC or chlamydia are unlikely, do not think a prophylactic treatment is warranted.  No signs of PID on exam.  Urinalysis clear, negative.  HIV, RPR and GC are pending.   Will treat with clindamycin for both dental pain and anti-inflammatories as needed for pain.  Urged patient to follow-up with dentist and OB/GYN.  She is aware she has STD testing positive results at which point she will need to inform any partners return precautions discussed.  Patient expresses understanding and is in agreement with this plan.   Final Clinical Impressions(s) / ED Diagnoses   Final diagnoses:  Pain, dental  Bacterial vaginosis    ED Discharge Orders         Ordered    clindamycin (CLEOCIN) 300 MG capsule  4 times daily     05/01/18 2351           Dartha LodgeFord, Adaline Trejos N, [place] JerseyPA-C 05/02/18 1148    Vanetta MuldersZackowski, Scott, MD 05/04/18 [phone]

## 2018-05-01 NOTE — ED Triage Notes (Signed)
Patient reports that she is having increased dental pain and an abscess of the left upper. Patient states she was seen previously for the same and will not be able to afford the surgery for another 2-3 weeks.  Patient also c/o thick white vaginal discharge and an open area of her vagina x 3 days.

## 2018-05-01 NOTE — Discharge Instructions (Addendum)
Please take clindamycin 4 times daily as directed this will treat your bacterial vaginosis as well as your dental pain.  You will need to follow-up with a dentist if your dental pain persists.  Return if you develop fevers, difficulty swallowing or breathing, pain under the tongue or in the neck.  You have bacterial vaginosis which is an overgrowth of the natural bacteria in the vagina, you have STD testing pending will be contacted in 2 to 3 days with any positive results, please use protection and notify any of your partners if you have positive results.  You may follow-up at the health department for further treatment if needed.

## 2018-05-02 LAB — RPR: RPR Ser Ql: NONREACTIVE

## 2018-05-02 LAB — HIV ANTIBODY (ROUTINE TESTING W REFLEX): HIV SCREEN 4TH GENERATION: NONREACTIVE

## 2018-05-02 NOTE — ED Notes (Signed)
[facility] to [facility] specimen sent yesterday was not labeled. Dr Ranae PalmsYelverton and Rubin PayorPickering aware. No orders given.

## 2018-07-29 ENCOUNTER — Emergency Department (HOSPITAL_COMMUNITY): Payer: No Typology Code available for payment source

## 2018-07-29 ENCOUNTER — Other Ambulatory Visit: Payer: Self-pay

## 2018-07-29 ENCOUNTER — Encounter (HOSPITAL_COMMUNITY): Payer: Self-pay | Admitting: *Deleted

## 2018-07-29 ENCOUNTER — Emergency Department (HOSPITAL_COMMUNITY)
Admission: EM | Admit: 2018-07-29 | Discharge: 2018-07-30 | Disposition: A | Discharge status: Home / Self Care | Payer: No Typology Code available for payment source | Attending: Emergency Medicine | Admitting: Emergency Medicine

## 2018-07-29 DIAGNOSIS — Y929 Unspecified place or not applicable: Secondary | ICD-10-CM | POA: Insufficient documentation

## 2018-07-29 DIAGNOSIS — S3992XA Unspecified injury of lower back, initial encounter: Secondary | ICD-10-CM | POA: Insufficient documentation

## 2018-07-29 DIAGNOSIS — S129XXA Fracture of neck, unspecified, initial encounter: Secondary | ICD-10-CM | POA: Diagnosis not present

## 2018-07-29 DIAGNOSIS — Y939 Activity, unspecified: Secondary | ICD-10-CM | POA: Insufficient documentation

## 2018-07-29 DIAGNOSIS — F17210 Nicotine dependence, cigarettes, uncomplicated: Secondary | ICD-10-CM | POA: Diagnosis not present

## 2018-07-29 DIAGNOSIS — Y999 Unspecified external cause status: Secondary | ICD-10-CM | POA: Diagnosis not present

## 2018-07-29 DIAGNOSIS — W19XXXA Unspecified fall, initial encounter: Secondary | ICD-10-CM

## 2018-07-29 LAB — I-STAT CHEM 8, ED
BUN: 7 mg/dL (ref 6–20)
CREATININE: 0.8 mg/dL (ref 0.44–1.00)
Calcium, Ion: 1.16 mmol/L (ref 1.15–1.40)
Chloride: 105 mmol/L (ref 98–111)
Glucose, Bld: 95 mg/dL (ref 70–99)
HEMATOCRIT: 36 % (ref 36.0–46.0)
HEMOGLOBIN: 12.2 g/dL (ref 12.0–15.0)
Potassium: 3.8 mmol/L (ref 3.5–5.1)
SODIUM: 141 mmol/L (ref 135–145)
TCO2: 25 mmol/L (ref 22–32)

## 2018-07-29 LAB — I-STAT BETA HCG BLOOD, ED (MC, WL, AP ONLY): I-stat hCG, quantitative: <5 m[IU]/mL (ref <5)

## 2018-07-29 MED ORDER — TRAMADOL HCL 50 MG PO TABS: 50.0000 mg | ORAL_TABLET | Freq: Four times a day (QID) | ORAL | 0 refills | Status: DC | PRN

## 2018-07-29 MED ORDER — ONDANSETRON HCL 4 MG/2ML IJ SOLN
4.0000 mg | Freq: Once | INTRAMUSCULAR | Status: AC
  Administered 2018-07-29: 4 mg via INTRAVENOUS
  Filled 2018-07-29: qty 2

## 2018-07-29 MED ORDER — HYDROMORPHONE HCL 1 MG/ML IJ SOLN
1.0000 mg | Freq: Once | INTRAMUSCULAR | Status: AC
  Administered 2018-07-29: 1 mg via INTRAVENOUS
  Filled 2018-07-29: qty 1

## 2018-07-29 MED ORDER — IOPAMIDOL (ISOVUE-300) INJECTION 61%
100.0000 mL | Freq: Once | INTRAVENOUS | Status: AC | PRN
  Administered 2018-07-29: 100 mL via INTRAVENOUS

## 2018-07-29 MED ORDER — IOPAMIDOL (ISOVUE-300) INJECTION 61%
INTRAVENOUS | Status: AC
  Filled 2018-07-29: qty 100

## 2018-07-29 MED ORDER — SODIUM CHLORIDE 0.9 % IJ SOLN
INTRAMUSCULAR | Status: AC
  Filled 2018-07-29: qty 50

## 2018-07-29 NOTE — Discharge Instructions (Addendum)
Follow-up with [facility] in 1 to 2 weeks for your neck and back
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
## 2018-07-31 ENCOUNTER — Emergency Department (HOSPITAL_COMMUNITY)
Admission: EM | Admit: 2018-07-31 | Discharge: 2018-07-31 | Disposition: A | Discharge status: Home / Self Care | Payer: No Typology Code available for payment source | Attending: Emergency Medicine | Admitting: Emergency Medicine

## 2018-07-31 ENCOUNTER — Encounter (HOSPITAL_COMMUNITY): Payer: Self-pay | Admitting: *Deleted

## 2018-07-31 DIAGNOSIS — G8929 Other chronic pain: Secondary | ICD-10-CM | POA: Insufficient documentation

## 2018-07-31 DIAGNOSIS — F17210 Nicotine dependence, cigarettes, uncomplicated: Secondary | ICD-10-CM | POA: Insufficient documentation

## 2018-07-31 DIAGNOSIS — M545 Low back pain: Secondary | ICD-10-CM | POA: Diagnosis not present

## 2018-07-31 MED ORDER — METHOCARBAMOL 500 MG PO TABS
750.0000 mg | ORAL_TABLET | Freq: Once | ORAL | Status: AC
  Administered 2018-07-31: 750 mg via ORAL
  Filled 2018-07-31: qty 2

## 2018-07-31 MED ORDER — METHOCARBAMOL 500 MG PO TABS: 500.0000 mg | ORAL_TABLET | Freq: Two times a day (BID) | ORAL | 0 refills | Status: AC

## 2018-07-31 NOTE — Discharge Instructions (Signed)
I have prescribed muscle relaxers for your pain, please do not drink or drive while taking this medications as they can make you drowsy.     If you experience any bowel or bladder incontinence, fever, worsening in your symptoms please return to the ED.  
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
## 2018-10-03 ENCOUNTER — Other Ambulatory Visit: Payer: Self-pay | Admitting: Chiropractic Medicine

## 2018-10-03 DIAGNOSIS — M5126 Other intervertebral disc displacement, lumbar region: Secondary | ICD-10-CM

## 2018-10-16 ENCOUNTER — Other Ambulatory Visit: Payer: Self-pay

## 2018-10-16 ENCOUNTER — Emergency Department (HOSPITAL_COMMUNITY)
Admission: EM | Admit: 2018-10-16 | Discharge: 2018-10-16 | Disposition: A | Discharge status: Home / Self Care | Payer: Self-pay | Attending: Emergency Medicine | Admitting: Emergency Medicine

## 2018-10-16 ENCOUNTER — Encounter (HOSPITAL_COMMUNITY): Payer: Self-pay | Admitting: Emergency Medicine

## 2018-10-16 DIAGNOSIS — Z79899 Other long term (current) drug therapy: Secondary | ICD-10-CM | POA: Insufficient documentation

## 2018-10-16 DIAGNOSIS — K047 Periapical abscess without sinus: Secondary | ICD-10-CM | POA: Insufficient documentation

## 2018-10-16 DIAGNOSIS — F17210 Nicotine dependence, cigarettes, uncomplicated: Secondary | ICD-10-CM | POA: Insufficient documentation

## 2018-10-16 MED ORDER — AMOXICILLIN 500 MG PO CAPS: 500.0000 mg | ORAL_CAPSULE | Freq: Three times a day (TID) | ORAL | 0 refills | Status: DC

## 2018-10-16 NOTE — Discharge Instructions (Signed)
Start Amoxicillin Take Ibuprofen 600mg  for pain three times daily Follow up with your dentist Return if worsening

## 2018-10-16 NOTE — ED Provider Notes (Signed)
[facility]-EMERGENCY DEPT Provider Note   CSN: [id number] Arrival date & time: 10/16/18  1259     History   Chief Complaint Chief Complaint  Patient presents with  . Dental Pain    HPI Cathy Sweeney is a 33 y.o. female who presents with a dental abscess.  No significant past medical history.  The patient states that she had a tooth pulled about 2 weeks ago.  She developed an abscess in the same area where the tooth was pulled over the past couple of days.  She made an appointment with her dentist but it is not until tomorrow.  This morning the abscess burst and she told the dental clinic and they advised her to come to the emergency department for antibiotics.  The patient denies fever, difficulty swallowing, facial swelling, trouble breathing.  She has some mild nausea without vomiting.  HPI  Past Medical History:  Diagnosis Date  . Ectopic pregnancy     Patient Active Problem List   Diagnosis Date Noted  . Ectopic pregnancy 09/11/2011  . Postop check 09/11/2011    Past Surgical History:  Procedure Laterality Date  . LAPAROSCOPY  08/28/2011   Procedure: LAPAROSCOPY OPERATIVE;  Surgeon: Lesly Dukes, MD;  Location: [facility];  Service: Gynecology;  Laterality: Right;  Ectopic  . SALPINGECTOMY       OB History    Gravida  3   Para      Term      Preterm      AB  3   Living        SAB      TAB  0   Ectopic  3   Multiple      Live Births               Home Medications    Prior to Admission medications   Medication Sig Start Date End Date Taking? Authorizing Provider  amoxicillin (AMOXIL) 500 MG capsule Take 1 capsule (500 mg total) by mouth 3 (three) times daily. 10/16/18   Bethel Born, PA-C  ibuprofen (ADVIL,MOTRIN) 200 MG tablet Take 600-800 mg by mouth 2 (two) times daily as needed.    [provider]  traMADol (ULTRAM) 50 MG tablet Take 1 tablet (50 mg total) by mouth every 6 (six) hours as  needed. Patient taking differently: Take 50 mg by mouth every 6 (six) hours as needed for moderate pain.  07/29/18   Bethann Berkshire, MD    Family History Family History  Problem Relation Age of Onset  . Hypertension Father   . Heart disease Father     Social History Social History   Tobacco Use  . Smoking status: Current Some Day Smoker    Packs/day: 0.25    Types: Cigarettes  . Smokeless tobacco: Never Used  Substance Use Topics  . Alcohol use: Yes    Alcohol/week: 0.0 standard drinks    Comment: occasional alcohol  . Drug use: No     Allergies   Tramadol   Review of Systems Review of Systems  Constitutional: Negative for fever.  HENT: Positive for dental problem.      Physical Exam Updated Vital Signs BP 126/82 (BP Location: Right Arm)   Pulse 64   Temp 98.2 F (36.8 C) (Oral)   Resp 16   SpO2 100%   Physical Exam Vitals signs and nursing note reviewed.  Constitutional:      General: She is not in acute distress.  Appearance: Normal appearance. She is well-developed.     Comments: Calm, cooperative.  Poor dentition  HENT:     Head: Normocephalic and atraumatic.     Mouth/Throat:     Comments: Multiple caries.  Area of erythema over the gumline where the left upper molar has been pulled Eyes:     General: No scleral icterus.       Right eye: No discharge.        Left eye: No discharge.     Conjunctiva/sclera: Conjunctivae normal.     Pupils: Pupils are equal, round, and reactive to light.  Neck:     Musculoskeletal: Normal range of motion.  Cardiovascular:     Rate and Rhythm: Normal rate.  Pulmonary:     Effort: Pulmonary effort is normal. No respiratory distress.  Abdominal:     General: There is no distension.  Skin:    General: Skin is warm and dry.  Neurological:     Mental Status: She is alert and oriented to person, place, and time.  Psychiatric:        Behavior: Behavior normal.      ED Treatments / Results  Labs (all labs  ordered are listed, but only abnormal results are displayed) Labs Reviewed - No data to display  EKG None  Radiology No results found.  Procedures Procedures (including critical care time)  Medications Ordered in ED Medications - No data to display   Initial Impression / Assessment and Plan / ED Course  I have reviewed the triage vital signs and the nursing notes.  Pertinent labs & imaging results that were available during my care of the patient were reviewed by me and considered in my medical decision making (see chart for details).  33 year old female presents with a dental abscess which has ruptured prior to my evaluation.  Her vital signs are normal.  She has no significant facial swelling or dental pain.  There is mild erythema in the area where the abscess was.  We will start her on amoxicillin.  She has a follow-up with her dentist tomorrow.  Final Clinical Impressions(s) / ED Diagnoses   Final diagnoses:  Dental abscess    ED Discharge Orders         Ordered    amoxicillin (AMOXIL) 500 MG capsule  3 times daily     10/16/18 1518           Bethel BornGekas, Elwyn Klosinski Marie, PA-C 10/16/18 1524    Raeford RazorKohut, Stephen, MD 10/18/18 [phone]

## 2018-10-16 NOTE — ED Triage Notes (Signed)
Complaint of right upper dental abscess. Pt verbalizes "tooth recently pulled."

## 2018-10-26 ENCOUNTER — Inpatient Hospital Stay (HOSPITAL_COMMUNITY)
Admission: AD | Admit: 2018-10-26 | Discharge: 2018-10-27 | Disposition: A | Discharge status: Home / Self Care | Payer: Self-pay | Source: Ambulatory Visit | Attending: Obstetrics and Gynecology | Admitting: Obstetrics and Gynecology

## 2018-10-26 DIAGNOSIS — N898 Other specified noninflammatory disorders of vagina: Secondary | ICD-10-CM

## 2018-10-26 DIAGNOSIS — N760 Acute vaginitis: Secondary | ICD-10-CM | POA: Insufficient documentation

## 2018-10-26 DIAGNOSIS — F17210 Nicotine dependence, cigarettes, uncomplicated: Secondary | ICD-10-CM | POA: Insufficient documentation

## 2018-10-27 DIAGNOSIS — N760 Acute vaginitis: Secondary | ICD-10-CM

## 2018-10-27 DIAGNOSIS — N898 Other specified noninflammatory disorders of vagina: Secondary | ICD-10-CM

## 2018-10-27 LAB — WET PREP, GENITAL
Clue Cells Wet Prep HPF POC: NONE SEEN
Sperm: NONE SEEN
Trich, Wet Prep: NONE SEEN
Yeast Wet Prep HPF POC: NONE SEEN

## 2018-10-27 MED ORDER — TERCONAZOLE 0.4 % VA CREA: 1.0000 | TOPICAL_CREAM | Freq: Every day | VAGINAL | 0 refills | Status: AC

## 2018-10-27 NOTE — MAU Provider Note (Signed)
History     CSN: [id number]  Arrival date and time: 10/26/18 2340   None     Chief Complaint  Patient presents with  . Vaginal Discharge  . Vaginal Itching   HPI   [name] is a 33 y.o. female G3P0030 here with thick white vaginal discharge and irritation for 2 days. Says her vagina is tender. She has not tried anything over the counter for the symptoms. She says she has itching on the outside and inside of her vagina. No bleeding. Does not have a GYN provider.   OB History    Gravida  3   Para      Term      Preterm      AB  3   Living        SAB      TAB  0   Ectopic  3   Multiple      Live Births              Past Medical History:  Diagnosis Date  . Ectopic pregnancy     Past Surgical History:  Procedure Laterality Date  . LAPAROSCOPY  08/28/2011   Procedure: LAPAROSCOPY OPERATIVE;  Surgeon: Lesly Dukes, MD;  Location: [facility];  Service: Gynecology;  Laterality: Right;  Ectopic  . SALPINGECTOMY      Family History  Problem Relation Age of Onset  . Hypertension Father   . Heart disease Father     Social History   Tobacco Use  . Smoking status: Current Some Day Smoker    Packs/day: 0.25    Types: Cigarettes  . Smokeless tobacco: Never Used  Substance Use Topics  . Alcohol use: Yes    Alcohol/week: 0.0 standard drinks    Comment: occasional alcohol  . Drug use: No    Allergies:  Allergies  Allergen Reactions  . Tramadol Itching    Medications Prior to Admission  Medication Sig Dispense Refill Last Dose  . amoxicillin (AMOXIL) 500 MG capsule Take 1 capsule (500 mg total) by mouth 3 (three) times daily. 21 capsule 0   . ibuprofen (ADVIL,MOTRIN) 200 MG tablet Take 600-800 mg by mouth 2 (two) times daily as needed.   unknown  . traMADol (ULTRAM) 50 MG tablet Take 1 tablet (50 mg total) by mouth every 6 (six) hours as needed. (Patient taking differently: Take 50 mg by mouth every 6 (six) hours as needed for moderate  pain. ) 20 tablet 0 07/30/2018 at Unknown time   Results for orders placed or performed during the hospital encounter of 10/26/18 (from the past 48 hour(s))  Wet prep, genital     Status: Abnormal   Collection Time: 10/27/18 12:20 AM  Result Value Ref Range   Yeast Wet Prep HPF POC NONE SEEN NONE SEEN   Trich, Wet Prep NONE SEEN NONE SEEN   Clue Cells Wet Prep HPF POC NONE SEEN NONE SEEN   WBC, Wet Prep HPF POC FEW (A) NONE SEEN    Comment: MODERATE BACTERIA SEEN   Sperm NONE SEEN     Comment: Performed at [facility], [address], [place], [place] [address]   Results for orders placed or performed during the hospital encounter of 10/26/18 (from the past 48 hour(s))  Wet prep, genital     Status: Abnormal   Collection Time: 10/27/18 12:20 AM  Result Value Ref Range   Yeast Wet Prep HPF POC NONE SEEN NONE SEEN   Trich, Wet Prep  NONE SEEN NONE SEEN   Clue Cells Wet Prep HPF POC NONE SEEN NONE SEEN   WBC, Wet Prep HPF POC FEW (A) NONE SEEN    Comment: MODERATE BACTERIA SEEN   Sperm NONE SEEN     Comment: Performed at [facility], [address], [place], [place] [address]    Review of Systems  Gastrointestinal: Negative for abdominal pain.  Genitourinary: Positive for vaginal discharge. Negative for vaginal bleeding.   Physical Exam   Blood pressure 118/79, pulse 66, temperature 97.8 F (36.6 C), resp. rate 16, height 5\' 6"  (1.676 m), weight 72.6 kg, last menstrual period 10/13/2018.  Physical Exam  Constitutional: She is oriented to person, place, and time. She appears well-developed and well-nourished. She appears distressed.  Genitourinary:    Genitourinary Comments: Wet prep and GC collected without speculum    Musculoskeletal: Normal range of motion.  Neurological: She is alert and oriented to person, place, and time.  Skin: She is not diaphoretic.   MAU Course  Procedures  None  MDM  Wet prep without yeast. Patient is certain she needs medication for  yeast infection.   Assessment and Plan   A:  1. Vaginal irritation   2. Acute vaginitis     P:  Discharge home in stable condition Informed patient about walk in clinic Establish GYN care Rx: Jerrell Mylar, NP 10/28/2018 1:21 PM

## 2018-10-27 NOTE — MAU Note (Signed)
Venia CarbonJennifer Rasch NP in Triage to discuss test results and plan of care

## 2018-10-27 NOTE — Progress Notes (Signed)
Written and verbal d/c instructions given and understanding voiced. 

## 2018-10-27 NOTE — MAU Note (Signed)
Think I have BV again. Just finished AMoxicillin for tooth abscess. Sometimes that happens after I take antibiotics. Some vag itching and irritation with thick white d/c for 2 days. No odor

## 2018-10-27 NOTE — Discharge Instructions (Signed)
.cwh  Vaginitis Vaginitis is a condition in which the vaginal tissue swells and becomes red (inflamed). This condition is most often caused by a change in the normal balance of bacteria and yeast that live in the vagina. This change causes an overgrowth of certain bacteria or yeast, which causes the inflammation. There are different types of vaginitis, but the most common types are:  Bacterial vaginosis.  Yeast infection (candidiasis).  Trichomoniasis vaginitis. This is a sexually transmitted disease (STD).  Viral vaginitis.  Atrophic vaginitis.  Allergic vaginitis. What are the causes? The cause of this condition depends on the type of vaginitis. It can be caused by:  Bacteria (bacterial vaginosis).  Yeast, which is a fungus (yeast infection).  A parasite (trichomoniasis vaginitis).  A virus (viral vaginitis).  Low hormone levels (atrophic vaginitis). Low hormone levels can occur during pregnancy, breastfeeding, or after menopause.  Irritants, such as bubble baths, scented tampons, and feminine sprays (allergic vaginitis). Other factors can change the normal balance of the yeast and bacteria that live in the vagina. These include:  Antibiotic medicines.  Poor hygiene.  Diaphragms, vaginal sponges, spermicides, birth control pills, and intrauterine devices (IUD).  Sex.  Infection.  Uncontrolled diabetes.  A weakened defense (immune) system. What increases the risk? This condition is more likely to develop in women who:  Smoke.  Use vaginal douches, scented tampons, or scented sanitary pads.  Wear tight-fitting pants.  Wear thong underwear.  Use oral birth control pills or an IUD.  Have sex without a condom.  Have multiple sex partners.  Have an STD.  Frequently use the spermicide nonoxynol-9.  Eat lots of foods high in sugar.  Have uncontrolled diabetes.  Have low estrogen levels.  Have a weakened immune system from an immune disorder or medical  treatment.  Are pregnant or breastfeeding. What are the signs or symptoms? Symptoms vary depending on the cause of the vaginitis. Common symptoms include:  Abnormal vaginal discharge. ? The discharge is white, gray, or yellow with bacterial vaginosis. ? The discharge is thick, white, and cheesy with a yeast infection. ? The discharge is frothy and yellow or greenish with trichomoniasis.  A bad vaginal smell. The smell is fishy with bacterial vaginosis.  Vaginal itching, pain, or swelling.  Sex that is painful.  Pain or burning when urinating. Sometimes there are no symptoms. How is this diagnosed? This condition is diagnosed based on your symptoms and medical history. A physical exam, including a pelvic exam, will also be done. You may also have other tests, including:  Tests to determine the pH level (acidity or alkalinity) of your vagina.  A whiff test, to assess the odor that results when a sample of your vaginal discharge is mixed with a potassium hydroxide solution.  Tests of vaginal fluid. A sample will be examined under a microscope. How is this treated? Treatment varies depending on the type of vaginitis you have. Your treatment may include:  Antibiotic creams or pills to treat bacterial vaginosis and trichomoniasis.  Antifungal medicines, such as vaginal creams or suppositories, to treat a yeast infection.  Medicine to ease discomfort if you have viral vaginitis. Your sexual partner should also be treated.  Estrogen delivered in a cream, pill, suppository, or vaginal ring to treat atrophic vaginitis. If vaginal dryness occurs, lubricants and moisturizing creams may help. You may need to avoid scented soaps, sprays, or douches.  Stopping use of a product that is causing allergic vaginitis. Then using a vaginal cream to treat the  symptoms. Follow these instructions at home: Lifestyle  Keep your genital area clean and dry. Avoid soap, and only rinse the area with  water.  Do not douche or use tampons until your health care provider says it is okay to do so. Use sanitary pads, if needed.  Do not have sex until your health care provider approves. When you can return to sex, practice safe sex and use condoms.  Wipe from front to back. This avoids the spread of bacteria from the rectum to the vagina. General instructions  Take over-the-counter and prescription medicines only as told by your health care provider.  If you were prescribed an antibiotic medicine, take or use it as told by your health care provider. Do not stop taking or using the antibiotic even if you start to feel better.  Keep all follow-up visits as told by your health care provider. This is important. How is this prevented?  Use mild, non-scented products. Do not use things that can irritate the vagina, such as fabric softeners. Avoid the following products if they are scented: ? Feminine sprays. ? Detergents. ? Tampons. ? Feminine hygiene products. ? Soaps or bubble baths.  Let air reach your genital area. ? Wear cotton underwear to reduce moisture buildup. ? Avoid wearing underwear while you sleep. ? Avoid wearing tight pants and underwear or nylons without a cotton panel. ? Avoid wearing thong underwear.  Take off any wet clothing, such as bathing suits, as soon as possible.  Practice safe sex and use condoms. Contact a health care provider if:  You have abdominal pain.  You have a fever.  You have symptoms that last for more than 2-3 days. Get help right away if:  You have a fever and your symptoms suddenly get worse. Summary  Vaginitis is a condition in which the vaginal tissue becomes inflamed.This condition is most often caused by a change in the normal balance of bacteria and yeast that live in the vagina.  Treatment varies depending on the type of vaginitis you have.  Do not douche, use tampons , or have sex until your health care provider approves. When  you can return to sex, practice safe sex and use condoms. This information is not intended to replace advice given to you by your health care provider. Make sure you discuss any questions you have with your health care provider. Document Released: 07/09/2007 Document Revised: 10/17/2016 Document Reviewed: 10/17/2016 Elsevier Interactive Patient Education  2019 [employer].

## 2020-05-03 DIAGNOSIS — U071 COVID-19: Secondary | ICD-10-CM | POA: Insufficient documentation

## 2020-05-03 DIAGNOSIS — F17210 Nicotine dependence, cigarettes, uncomplicated: Secondary | ICD-10-CM | POA: Insufficient documentation

## 2020-05-04 ENCOUNTER — Emergency Department (HOSPITAL_COMMUNITY)
Admission: EM | Admit: 2020-05-04 | Discharge: 2020-05-04 | Disposition: A | Discharge status: Home / Self Care | Payer: Self-pay | Attending: Emergency Medicine | Admitting: Emergency Medicine

## 2020-05-04 ENCOUNTER — Other Ambulatory Visit: Payer: Self-pay

## 2020-05-04 DIAGNOSIS — J029 Acute pharyngitis, unspecified: Secondary | ICD-10-CM

## 2020-05-04 LAB — SARS CORONAVIRUS 2 BY RT PCR (HOSPITAL ORDER, PERFORMED IN ~~LOC~~ HOSPITAL LAB): SARS Coronavirus 2: POSITIVE — AB

## 2020-05-04 MED ORDER — IBUPROFEN 200 MG PO TABS
600.0000 mg | ORAL_TABLET | Freq: Once | ORAL | Status: AC
  Administered 2020-05-04: 600 mg via ORAL
  Filled 2020-05-04: qty 3

## 2020-05-04 MED ORDER — IBUPROFEN 600 MG PO TABS: 600.0000 mg | ORAL_TABLET | Freq: Four times a day (QID) | ORAL | 0 refills | Status: AC | PRN

## 2020-05-04 MED ORDER — AMOXICILLIN 500 MG PO CAPS: 1000.0000 mg | ORAL_CAPSULE | Freq: Two times a day (BID) | ORAL | 0 refills | Status: DC

## 2020-05-04 MED ORDER — AMOXICILLIN 500 MG PO CAPS
1000.0000 mg | ORAL_CAPSULE | Freq: Once | ORAL | Status: AC
  Administered 2020-05-04: 1000 mg via ORAL
  Filled 2020-05-04: qty 2

## 2020-05-04 NOTE — ED Provider Notes (Signed)
[facility]-EMERGENCY DEPT Provider Note   CSN: [id number] Arrival date & time: 05/03/20  2335     History No chief complaint on file.   Cathy Sweeney is a 34 y.o. female.  Patient to ED with complaint of sore throat x 24 hours. She reports having ST last week that completely resolved and then restarted yesterday. No fever at home that she is aware of. No vomiting, diarrhea, cough. She reports nasal congestion. Pain was left sided at the start of symptoms and is now predominantly right sided.   The history is provided by the patient. No language interpreter was used.       Past Medical History:  Diagnosis Date  . Ectopic pregnancy     Patient Active Problem List   Diagnosis Date Noted  . Ectopic pregnancy 09/11/2011  . Postop check 09/11/2011    Past Surgical History:  Procedure Laterality Date  . LAPAROSCOPY  08/28/2011   Procedure: LAPAROSCOPY OPERATIVE;  Surgeon: Lesly Dukes, MD;  Location: [facility];  Service: Gynecology;  Laterality: Right;  Ectopic  . SALPINGECTOMY       OB History    Gravida  3   Para      Term      Preterm      AB  3   Living        SAB      TAB  0   Ectopic  3   Multiple      Live Births              Family History  Problem Relation Age of Onset  . Hypertension Father   . Heart disease Father     Social History   Tobacco Use  . Smoking status: Current Some Day Smoker    Packs/day: 0.25    Types: Cigarettes  . Smokeless tobacco: Never Used  Vaping Use  . Vaping Use: Never used  Substance Use Topics  . Alcohol use: Yes    Alcohol/week: 0.0 standard drinks    Comment: occasional alcohol  . Drug use: No    Home Medications Prior to Admission medications   Medication Sig Start Date End Date Taking? Authorizing Provider  amoxicillin (AMOXIL) 500 MG capsule Take 1 capsule (500 mg total) by mouth 3 (three) times daily. 10/16/18   Bethel Born, PA-C  ibuprofen (ADVIL,MOTRIN) 200  MG tablet Take 600-800 mg by mouth 2 (two) times daily as needed.    [provider]  terconazole (TERAZOL 7) 0.4 % vaginal cream Place 1 applicator vaginally at bedtime. 10/27/18   Rasch, Victorino Dike I, NP  traMADol (ULTRAM) 50 MG tablet Take 1 tablet (50 mg total) by mouth every 6 (six) hours as needed. Patient taking differently: Take 50 mg by mouth every 6 (six) hours as needed for moderate pain.  07/29/18   Bethann Berkshire, MD    Allergies    Tramadol  Review of Systems   Review of Systems  Constitutional: Negative for chills and fever.  HENT: Positive for congestion and sore throat. Negative for trouble swallowing and voice change.   Respiratory: Negative.  Negative for cough.   Cardiovascular: Negative.   Gastrointestinal: Negative.  Negative for abdominal pain and nausea.  Musculoskeletal: Negative.   Skin: Negative.   Neurological: Negative.     Physical Exam Updated Vital Signs BP 129/84 (BP Location: Right Arm)   Pulse 93   Temp (!) 101 F (38.3 C) (Oral)   Resp 16  Ht 5\' 7"  (1.702 m)   Wt 72.6 kg   SpO2 100%   BMI 25.06 kg/m   Physical Exam Vitals and nursing note reviewed.  Constitutional:      General: She is not in acute distress.    Appearance: Normal appearance. She is not ill-appearing or toxic-appearing.  HENT:     Head: Normocephalic.     Right Ear: Tympanic membrane normal.     Left Ear: Tympanic membrane normal.     Nose: Nose normal.     Mouth/Throat:     Mouth: Mucous membranes are moist.     Pharynx: Posterior oropharyngeal erythema present. No oropharyngeal exudate.     Comments: Uvula midline. There is R>L oropharyngeal swelling and redness. No exudates. No visualized peritonsillar abscess.  Pulmonary:     Effort: Pulmonary effort is normal.  Musculoskeletal:     Cervical back: Normal range of motion and neck supple.  Lymphadenopathy:     Cervical: No cervical adenopathy.  Neurological:     Mental Status: She is alert.     ED  Results / Procedures / Treatments   Labs (all labs ordered are listed, but only abnormal results are displayed) Labs Reviewed - No data to display  EKG None  Radiology No results found.  Procedures Procedures (including critical care time)  Medications Ordered in ED Medications - No data to display  ED Course  I have reviewed the triage vital signs and the nursing notes.  Pertinent labs & imaging results that were available during my care of the patient were reviewed by me and considered in my medical decision making (see chart for details).    MDM Rules/Calculators/A&P                          Patient to ED with sore throat, congestion, febrile in ED, no cough x 1 day.   No peritonsillar abscess visualized. Concern for early abscess as pain is now unilateral. Uvula midline. Will start Amoxil and encourage 2 day recheck in the ED.    Final Clinical Impression(s) / ED Diagnoses Final diagnoses:  None   1. Pharyngitis  Rx / DC Orders ED Discharge Orders    None       , PA-C 05/04/20 0358    07/04/20, MD 05/05/20 [phone]

## 2020-05-04 NOTE — Discharge Instructions (Signed)
Take medications as prescribed for infection and pain. Continue salt water gargles, recommend lozenges and cold fluids for additional comfort.   Return here in 2 days for recheck if no better or if symptoms worsen for recheck to insure there is no development of peritonsillar abscess.

## 2021-08-23 ENCOUNTER — Ambulatory Visit (HOSPITAL_COMMUNITY)
Admission: EM | Admit: 2021-08-23 | Discharge: 2021-08-23 | Disposition: A | Discharge status: Home / Self Care | Payer: No Typology Code available for payment source | Attending: Emergency Medicine | Admitting: Emergency Medicine

## 2021-08-23 ENCOUNTER — Encounter (HOSPITAL_COMMUNITY): Payer: Self-pay | Admitting: Emergency Medicine

## 2021-08-23 ENCOUNTER — Emergency Department (HOSPITAL_COMMUNITY): Payer: No Typology Code available for payment source

## 2021-08-23 ENCOUNTER — Emergency Department (HOSPITAL_COMMUNITY): Payer: No Typology Code available for payment source | Admitting: Certified Registered"

## 2021-08-23 ENCOUNTER — Other Ambulatory Visit (HOSPITAL_COMMUNITY): Payer: Self-pay

## 2021-08-23 ENCOUNTER — Other Ambulatory Visit: Payer: Self-pay

## 2021-08-23 ENCOUNTER — Encounter (HOSPITAL_COMMUNITY)
Admission: EM | Disposition: A | Discharge status: Home / Self Care | Payer: Self-pay | Source: Home / Self Care | Attending: Emergency Medicine

## 2021-08-23 DIAGNOSIS — F1290 Cannabis use, unspecified, uncomplicated: Secondary | ICD-10-CM | POA: Insufficient documentation

## 2021-08-23 DIAGNOSIS — S0181XA Laceration without foreign body of other part of head, initial encounter: Secondary | ICD-10-CM | POA: Insufficient documentation

## 2021-08-23 DIAGNOSIS — S01111A Laceration without foreign body of right eyelid and periocular area, initial encounter: Secondary | ICD-10-CM | POA: Insufficient documentation

## 2021-08-23 DIAGNOSIS — Z23 Encounter for immunization: Secondary | ICD-10-CM | POA: Insufficient documentation

## 2021-08-23 DIAGNOSIS — T1490XA Injury, unspecified, initial encounter: Secondary | ICD-10-CM

## 2021-08-23 DIAGNOSIS — F17200 Nicotine dependence, unspecified, uncomplicated: Secondary | ICD-10-CM | POA: Insufficient documentation

## 2021-08-23 DIAGNOSIS — Z20822 Contact with and (suspected) exposure to covid-19: Secondary | ICD-10-CM | POA: Insufficient documentation

## 2021-08-23 HISTORY — PX: FACIAL LACERATION REPAIR: SHX6589

## 2021-08-23 LAB — CBC
HCT: 41.5 % (ref 36.0–46.0)
Hemoglobin: 13.9 g/dL (ref 12.0–15.0)
MCH: 32.9 pg (ref 26.0–34.0)
MCHC: 33.5 g/dL (ref 30.0–36.0)
MCV: 98.1 fL (ref 80.0–100.0)
Platelets: 217 10*3/uL (ref 150–400)
RBC: 4.23 MIL/uL (ref 3.87–5.11)
RDW: 13.5 % (ref 11.5–15.5)
WBC: 8.9 10*3/uL (ref 4.0–10.5)
nRBC: 0 % (ref 0.0–0.2)

## 2021-08-23 LAB — URINALYSIS, ROUTINE W REFLEX MICROSCOPIC
Bacteria, UA: NONE SEEN
Bilirubin Urine: NEGATIVE
Glucose, UA: 50 mg/dL — AB
Ketones, ur: NEGATIVE mg/dL
Leukocytes,Ua: NEGATIVE
Nitrite: NEGATIVE
Protein, ur: NEGATIVE mg/dL
Specific Gravity, Urine: 1.03 (ref 1.005–1.030)
pH: 7 (ref 5.0–8.0)

## 2021-08-23 LAB — RAPID URINE DRUG SCREEN, HOSP PERFORMED
Amphetamines: NOT DETECTED
Barbiturates: NOT DETECTED
Benzodiazepines: NOT DETECTED
Cocaine: NOT DETECTED
Opiates: NOT DETECTED
Tetrahydrocannabinol: POSITIVE — AB

## 2021-08-23 LAB — COMPREHENSIVE METABOLIC PANEL
ALT: 18 U/L (ref 0–44)
AST: 30 U/L (ref 15–41)
Albumin: 3.9 g/dL (ref 3.5–5.0)
Alkaline Phosphatase: 53 U/L (ref 38–126)
Anion gap: 11 (ref 5–15)
BUN: 9 mg/dL (ref 6–20)
CO2: 19 mmol/L — ABNORMAL LOW (ref 22–32)
Calcium: 9 mg/dL (ref 8.9–10.3)
Chloride: 106 mmol/L (ref 98–111)
Creatinine, Ser: 0.73 mg/dL (ref 0.44–1.00)
GFR, Estimated: >60 mL/min (ref >60)
Glucose, Bld: 125 mg/dL — ABNORMAL HIGH (ref 70–99)
Potassium: 3.4 mmol/L — ABNORMAL LOW (ref 3.5–5.1)
Sodium: 136 mmol/L (ref 135–145)
Total Bilirubin: 0.5 mg/dL (ref 0.3–1.2)
Total Protein: 6.7 g/dL (ref 6.5–8.1)

## 2021-08-23 LAB — RESP PANEL BY RT-PCR (FLU A&B, COVID) ARPGX2
Influenza A by PCR: NEGATIVE
Influenza B by PCR: NEGATIVE
SARS Coronavirus 2 by RT PCR: NEGATIVE

## 2021-08-23 LAB — ETHANOL: Alcohol, Ethyl (B): 32 mg/dL — ABNORMAL HIGH (ref <10)

## 2021-08-23 LAB — I-STAT CHEM 8, ED
BUN: 10 mg/dL (ref 6–20)
Calcium, Ion: 1.05 mmol/L — ABNORMAL LOW (ref 1.15–1.40)
Chloride: 107 mmol/L (ref 98–111)
Creatinine, Ser: 0.7 mg/dL (ref 0.44–1.00)
Glucose, Bld: 126 mg/dL — ABNORMAL HIGH (ref 70–99)
HCT: 39 % (ref 36.0–46.0)
Hemoglobin: 13.3 g/dL (ref 12.0–15.0)
Potassium: 3.3 mmol/L — ABNORMAL LOW (ref 3.5–5.1)
Sodium: 138 mmol/L (ref 135–145)
TCO2: 20 mmol/L — ABNORMAL LOW (ref 22–32)

## 2021-08-23 LAB — I-STAT BETA HCG BLOOD, ED (MC, WL, AP ONLY): I-stat hCG, quantitative: <5 m[IU]/mL (ref <5)

## 2021-08-23 LAB — SAMPLE TO BLOOD BANK

## 2021-08-23 LAB — LACTIC ACID, PLASMA: Lactic Acid, Venous: 2.7 mmol/L (ref 0.5–1.9)

## 2021-08-23 LAB — PROTIME-INR
INR: 1 (ref 0.8–1.2)
Prothrombin Time: 12.7 seconds (ref 11.4–15.2)

## 2021-08-23 SURGERY — REPAIR, LACERATION, FACE
Anesthesia: General | Site: Face

## 2021-08-23 MED ORDER — BACITRACIN-POLYMYXIN B 500-10000 UNIT/GM OP OINT
TOPICAL_OINTMENT | OPHTHALMIC | Status: DC | PRN
  Administered 2021-08-23: 1 via OPHTHALMIC

## 2021-08-23 MED ORDER — ORAL CARE MOUTH RINSE: 15.0000 mL | Freq: Once | OROMUCOSAL | Status: AC

## 2021-08-23 MED ORDER — SODIUM CHLORIDE 0.9 % IR SOLN
Status: DC | PRN
  Administered 2021-08-23: 1000 mL

## 2021-08-23 MED ORDER — HYDROCODONE-ACETAMINOPHEN 5-325 MG PO TABS
1.0000 | ORAL_TABLET | Freq: Four times a day (QID) | ORAL | 0 refills | Status: AC | PRN
  Filled 2021-08-23: qty 20, 5d supply, fill #0

## 2021-08-23 MED ORDER — ONDANSETRON HCL 4 MG/2ML IJ SOLN
INTRAMUSCULAR | Status: DC | PRN
  Administered 2021-08-23: 4 mg via INTRAVENOUS

## 2021-08-23 MED ORDER — LIDOCAINE-EPINEPHRINE 1 %-1:100000 IJ SOLN: INTRAMUSCULAR | Status: DC | PRN

## 2021-08-23 MED ORDER — DIPHENHYDRAMINE HCL 50 MG/ML IJ SOLN
INTRAMUSCULAR | Status: DC | PRN
  Administered 2021-08-23: 12.5 mg via INTRAVENOUS

## 2021-08-23 MED ORDER — LACTATED RINGERS IV SOLN: INTRAVENOUS | Status: DC | PRN

## 2021-08-23 MED ORDER — AMISULPRIDE (ANTIEMETIC) 5 MG/2ML IV SOLN: 10.0000 mg | Freq: Once | INTRAVENOUS | Status: DC | PRN

## 2021-08-23 MED ORDER — CARBOXYMETHYLCELLULOSE SOD PF 0.5 % OP SOLN
2.0000 [drp] | Freq: Three times a day (TID) | OPHTHALMIC | 0 refills | Status: AC
  Filled 2021-08-23: qty 70, 7d supply, fill #0

## 2021-08-23 MED ORDER — DEXAMETHASONE SODIUM PHOSPHATE 10 MG/ML IJ SOLN
INTRAMUSCULAR | Status: DC | PRN
Start: 2021-08-23 — End: 2021-08-23
  Administered 2021-08-23: 10 mg via INTRAVENOUS

## 2021-08-23 MED ORDER — MEPERIDINE HCL 25 MG/ML IJ SOLN: 6.2500 mg | INTRAMUSCULAR | Status: DC | PRN

## 2021-08-23 MED ORDER — SUCCINYLCHOLINE CHLORIDE 200 MG/10ML IV SOSY
PREFILLED_SYRINGE | INTRAVENOUS | Status: DC | PRN
  Administered 2021-08-23: 80 mg via INTRAVENOUS

## 2021-08-23 MED ORDER — BSS IO SOLN
INTRAOCULAR | Status: DC | PRN
  Administered 2021-08-23: 15 mL via INTRAOCULAR

## 2021-08-23 MED ORDER — BACITRACIN ZINC 500 UNIT/GM EX OINT: TOPICAL_OINTMENT | CUTANEOUS | Status: DC | PRN

## 2021-08-23 MED ORDER — FENTANYL CITRATE (PF) 100 MCG/2ML IJ SOLN
25.0000 ug | INTRAMUSCULAR | Status: DC | PRN
  Administered 2021-08-23: 25 ug via INTRAVENOUS

## 2021-08-23 MED ORDER — TETANUS-DIPHTH-ACELL PERTUSSIS 5-2.5-18.5 LF-MCG/0.5 IM SUSY
0.5000 mL | PREFILLED_SYRINGE | Freq: Once | INTRAMUSCULAR | Status: AC
  Administered 2021-08-23: 0.5 mL via INTRAMUSCULAR

## 2021-08-23 MED ORDER — PHENYLEPHRINE 40 MCG/ML (10ML) SYRINGE FOR IV PUSH (FOR BLOOD PRESSURE SUPPORT)
PREFILLED_SYRINGE | INTRAVENOUS | Status: DC | PRN
  Administered 2021-08-23: 40 ug via INTRAVENOUS

## 2021-08-23 MED ORDER — LACTATED RINGERS IV SOLN: INTRAVENOUS | Status: DC

## 2021-08-23 MED ORDER — CEPHALEXIN 500 MG PO CAPS
500.0000 mg | ORAL_CAPSULE | Freq: Four times a day (QID) | ORAL | 0 refills | Status: AC
  Filled 2021-08-23: qty 40, 10d supply, fill #0

## 2021-08-23 MED ORDER — LIDOCAINE 2% (20 MG/ML) 5 ML SYRINGE
INTRAMUSCULAR | Status: DC | PRN
  Administered 2021-08-23: 50 mg via INTRAVENOUS
  Administered 2021-08-23: 70 mg via INTRAVENOUS

## 2021-08-23 MED ORDER — CEFAZOLIN SODIUM-DEXTROSE 2-4 GM/100ML-% IV SOLN
2.0000 g | Freq: Once | INTRAVENOUS | Status: AC
  Administered 2021-08-23: 2 g via INTRAVENOUS

## 2021-08-23 MED ORDER — TETRACAINE HCL 0.5 % OP SOLN: 2.0000 [drp] | Freq: Once | OPHTHALMIC | Status: DC

## 2021-08-23 MED ORDER — MIDAZOLAM HCL 2 MG/2ML IJ SOLN
INTRAMUSCULAR | Status: DC | PRN
Start: 2021-08-23 — End: 2021-08-23
  Administered 2021-08-23: 2 mg via INTRAVENOUS

## 2021-08-23 MED ORDER — FENTANYL CITRATE (PF) 250 MCG/5ML IJ SOLN
INTRAMUSCULAR | Status: DC | PRN
  Administered 2021-08-23 (×4): 50 ug via INTRAVENOUS
  Administered 2021-08-23: 100 ug via INTRAVENOUS

## 2021-08-23 MED ORDER — IOHEXOL 350 MG/ML SOLN
80.0000 mL | Freq: Once | INTRAVENOUS | Status: AC | PRN
  Administered 2021-08-23: 80 mL via INTRAVENOUS

## 2021-08-23 MED ORDER — FENTANYL CITRATE PF 50 MCG/ML IJ SOSY
50.0000 ug | PREFILLED_SYRINGE | Freq: Once | INTRAMUSCULAR | Status: AC
  Administered 2021-08-23: 50 ug via INTRAVENOUS

## 2021-08-23 MED ORDER — ONDANSETRON HCL 4 MG/2ML IJ SOLN
4.0000 mg | Freq: Once | INTRAMUSCULAR | Status: AC
  Administered 2021-08-23: 4 mg via INTRAVENOUS

## 2021-08-23 MED ORDER — SODIUM CHLORIDE 0.9 % IV SOLN
3.0000 g | INTRAVENOUS | Status: DC
  Filled 2021-08-23: qty 8

## 2021-08-23 MED ORDER — DOCUSATE SODIUM 100 MG PO CAPS
100.0000 mg | ORAL_CAPSULE | Freq: Two times a day (BID) | ORAL | 0 refills | Status: AC | PRN
  Filled 2021-08-23: qty 10, 5d supply, fill #0

## 2021-08-23 MED ORDER — CHLORHEXIDINE GLUCONATE 0.12 % MT SOLN
15.0000 mL | Freq: Once | OROMUCOSAL | Status: AC
  Administered 2021-08-23: 15 mL via OROMUCOSAL

## 2021-08-23 MED ORDER — PROPOFOL 10 MG/ML IV BOLUS
INTRAVENOUS | Status: DC | PRN
  Administered 2021-08-23: 20 mg via INTRAVENOUS
  Administered 2021-08-23: 40 mg via INTRAVENOUS
  Administered 2021-08-23: 200 mg via INTRAVENOUS

## 2021-08-23 MED ORDER — SODIUM CHLORIDE 0.9 % IV BOLUS
1000.0000 mL | Freq: Once | INTRAVENOUS | Status: AC
  Administered 2021-08-23: 1000 mL via INTRAVENOUS

## 2021-08-23 MED ORDER — HYDROMORPHONE HCL 1 MG/ML IJ SOLN: 0.5000 mg | INTRAMUSCULAR | Status: DC | PRN

## 2021-08-23 SURGICAL SUPPLY — 56 items
ADH SKN CLS APL DERMABOND .7 (GAUZE/BANDAGES/DRESSINGS) ×1
APL SWBSTK 6 STRL LF DISP (MISCELLANEOUS) ×1
APPLICATOR COTTON TIP 6 STRL (MISCELLANEOUS) ×1 IMPLANT
APPLICATOR COTTON TIP 6IN STRL (MISCELLANEOUS) ×2 IMPLANT
BAG COUNTER SPONGE SURGICOUNT (BAG) ×2 IMPLANT
BAG SPNG CNTER NS LX DISP (BAG) ×1
BAND INSRT 18 STRL LF DISP RB (MISCELLANEOUS)
BAND RUBBER #18 3X1/16 STRL (MISCELLANEOUS) IMPLANT
BLADE SURG 15 STRL LF DISP TIS (BLADE) ×1 IMPLANT
BLADE SURG 15 STRL SS (BLADE) ×2
BNDG CONFORM 2 STRL LF (GAUZE/BANDAGES/DRESSINGS) IMPLANT
CATH ROBINSON RED A/P 12FR (CATHETERS) IMPLANT
CORD BIPOLAR FORCEPS 12FT (ELECTRODE) ×1 IMPLANT
COVER SURGICAL LIGHT HANDLE (MISCELLANEOUS) ×4 IMPLANT
DERMABOND ADVANCED (GAUZE/BANDAGES/DRESSINGS) ×1
DERMABOND ADVANCED .7 DNX12 (GAUZE/BANDAGES/DRESSINGS) IMPLANT
DRAPE HALF SHEET 40X57 (DRAPES) IMPLANT
DRAPE ORTHO SPLIT 77X108 STRL (DRAPES) ×2
DRAPE SURG ORHT 6 SPLT 77X108 (DRAPES) ×1 IMPLANT
DRSG PAD ABDOMINAL 8X10 ST (GAUZE/BANDAGES/DRESSINGS) IMPLANT
ELECT COATED BLADE 2.86 ST (ELECTRODE) ×2 IMPLANT
ELECT REM PT RETURN 9FT ADLT (ELECTROSURGICAL) ×2
ELECTRODE REM PT RTRN 9FT ADLT (ELECTROSURGICAL) ×1 IMPLANT
GAUZE 4X4 16PLY ~~LOC~~+RFID DBL (SPONGE) ×2 IMPLANT
GAUZE SPONGE 4X4 12PLY STRL (GAUZE/BANDAGES/DRESSINGS) IMPLANT
GLOVE SURG ENC MOIS LTX SZ6.5 (GLOVE) ×2 IMPLANT
GOWN STRL REUS W/ TWL LRG LVL3 (GOWN DISPOSABLE) ×1 IMPLANT
GOWN STRL REUS W/TWL LRG LVL3 (GOWN DISPOSABLE) ×2
KIT BASIN OR (CUSTOM PROCEDURE TRAY) ×2 IMPLANT
KIT TURNOVER KIT B (KITS) ×2 IMPLANT
MARKER SKIN DUAL TIP RULER LAB (MISCELLANEOUS) ×2 IMPLANT
NDL HYPO 25GX1X1/2 BEV (NEEDLE) ×1 IMPLANT
NEEDLE HYPO 25GX1X1/2 BEV (NEEDLE) ×2 IMPLANT
NS IRRIG 1000ML POUR BTL (IV SOLUTION) ×2 IMPLANT
PACK SURGICAL SETUP 50X90 (CUSTOM PROCEDURE TRAY) ×2 IMPLANT
PAD ARMBOARD 7.5X6 YLW CONV (MISCELLANEOUS) ×4 IMPLANT
PENCIL SMOKE EVACUATOR (MISCELLANEOUS) ×2 IMPLANT
POSITIONER HEAD DONUT 9IN (MISCELLANEOUS) IMPLANT
PROTECTOR CORNEAL (OPHTHALMIC RELATED) ×1 IMPLANT
SHIELD EYE PEDIATRIC STRL (MISCELLANEOUS) ×1 IMPLANT
STRIP CLOSURE SKIN 1/4X4 (GAUZE/BANDAGES/DRESSINGS) ×1 IMPLANT
SUT CHROMIC 5 0 RB 1 27 (SUTURE) IMPLANT
SUT ETHILON 2 0 FS 18 (SUTURE) IMPLANT
SUT MNCRL 6-0 1X18 PC-1 (SUTURE) IMPLANT
SUT MNCRL AB 4-0 PS2 18 (SUTURE) IMPLANT
SUT MONOCRYL 6-0 (SUTURE) ×2
SUT PDS 5 0 RB 1 (SUTURE) IMPLANT
SUT PLAIN 6 0 TG1408 (SUTURE) ×2 IMPLANT
SUT SILK 2 0 SH CR/8 (SUTURE) IMPLANT
SUT VIC AB 5-0 P-3 18X BRD (SUTURE) IMPLANT
SUT VIC AB 5-0 P3 18 (SUTURE) ×2
SYR BULB EAR ULCER 3OZ GRN STR (SYRINGE) ×2 IMPLANT
SYR BULB IRRIG 60ML STRL (SYRINGE) ×2 IMPLANT
SYR CONTROL 10ML LL (SYRINGE) ×3 IMPLANT
TUBE CONNECTING 12X1/4 (SUCTIONS) ×2 IMPLANT
YANKAUER SUCT BULB TIP NO VENT (SUCTIONS) ×2 IMPLANT
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
## 2021-08-23 NOTE — Transfer of Care (Signed)
Immediate Anesthesia Transfer of Care Note  Patient: Cathy Sweeney  Procedure(s) Performed: FACIAL LACERATION REPAIR (Face)  Patient Location: PACU  Anesthesia Type:General  Level of Consciousness: awake, alert  and oriented  Airway & Oxygen Therapy: Patient Spontanous Breathing  Post-op Assessment: Report given to RN, Post -op Vital signs reviewed and stable and Patient moving all extremities X 4  Post vital signs: Reviewed and stable  Last Vitals:  Vitals Value Taken Time  BP 133/84 08/23/21 1434  Temp    Pulse 114 08/23/21 1436  Resp 19 08/23/21 1436  SpO2 94 % 08/23/21 1436  Vitals shown include unvalidated device data.  Last Pain:  Vitals:   08/23/21 1208  TempSrc: Oral  PainSc:          Complications: No notable events documented.
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
## 2021-08-23 NOTE — Anesthesia Preprocedure Evaluation (Addendum)
Anesthesia Evaluation  Patient identified by MRN, date of birth, ID band Patient awake    Reviewed: Allergy & Precautions, NPO status , Patient's Chart, lab work & pertinent test results  Airway Mallampati: II  TM Distance: >3 FB Neck ROM: Full    Dental no notable dental hx. (+) Dental Advisory Given, Teeth Intact   Pulmonary neg pulmonary ROS, Current Smoker,    Pulmonary exam normal breath sounds clear to auscultation       Cardiovascular negative cardio ROS Normal cardiovascular exam Rhythm:Regular Rate:Normal     Neuro/Psych negative neurological ROS     GI/Hepatic negative GI ROS, (+)     substance abuse  alcohol use and marijuana use,   Endo/Other  negative endocrine ROS  Renal/GU negative Renal ROS     Musculoskeletal negative musculoskeletal ROS (+)   Abdominal (+) - obese,   Peds  Hematology negative hematology ROS (+)   Anesthesia Other Findings   Reproductive/Obstetrics negative OB ROS                            Anesthesia Physical Anesthesia Plan  ASA: 2  Anesthesia Plan: General   Post-op Pain Management: Ofirmev IV (intra-op) and Minimal or no pain anticipated   Induction: Intravenous, Rapid sequence and Cricoid pressure planned  PONV Risk Score and Plan: 4 or greater and Treatment may vary due to age or medical condition, Ondansetron, Dexamethasone, Midazolam and Diphenhydramine  Airway Management Planned: Oral ETT  Additional Equipment: None  Intra-op Plan:   Post-operative Plan: Extubation in OR  Informed Consent: I have reviewed the patients History and Physical, chart, labs and discussed the procedure including the risks, benefits and alternatives for the proposed anesthesia with the patient or authorized representative who has indicated his/her understanding and acceptance.     Dental advisory given  Plan Discussed with: CRNA  Anesthesia Plan  Comments:        Anesthesia Quick Evaluation
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
## 2021-08-23 NOTE — ED Notes (Signed)
Patient transported to [facility] with small personal bag, boyfriend given all other belongings and directions to 2nd floor waiting room. Plans to go home to get clothes for patient and return.

## 2021-08-23 NOTE — Anesthesia Postprocedure Evaluation (Signed)
Anesthesia Post Note  Patient: Cathy Sweeney  Procedure(s) Performed: FACIAL LACERATION REPAIR (Face)     Patient location during evaluation: PACU Anesthesia Type: General Level of consciousness: sedated and patient cooperative Pain management: pain level controlled Vital Signs Assessment: post-procedure vital signs reviewed and stable Respiratory status: spontaneous breathing Cardiovascular status: stable Anesthetic complications: no   No notable events documented.  Last Vitals:  Vitals:   08/23/21 1450 08/23/21 1505  BP: (!) 130/92 124/81  Pulse: 91 82  Resp: 15 14  Temp:  36.7 C  SpO2: 98% 98%    Last Pain:  Vitals:   08/23/21 1505  TempSrc:   PainSc: Asleep                 Lewie Loron

## 2021-08-23 NOTE — ED Notes (Signed)
Delay in CT d/t delay in hcg result in lab.
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
## 2021-08-23 NOTE — Op Note (Addendum)
OPERATIVE NOTE  Cathy Sweeney Date/Time of Admission: 08/23/2021  7:18 AM  CSN: [id number];MRN:[id number] Attending Provider: No att. providers found Room/Bed: MCPO/NONE DOB: 1986-04-27 Age: 35 y.o.   Pre-Op Diagnosis: Complex facial Laceration  Post-Op Diagnosis: Complex facial Laceration  Procedure: Procedure(s): REPAIR COMPLEX FACIAL LACERATION FOREHEAD 3CM (13132) REPAIR COMPLEX LACERATION EYELID 3CM (13152)  Anesthesia: General  Surgeon(s): Emerald Shor A Amilee Janvier, DO  Staff: Circulator: Thomasene Lot, RN; Rolan Lipa, RN Relief Circulator: Eual Fines, RN Scrub Person: Leonia Corona, RN  Implants: * No implants in log *  Specimens: * No specimens in log *  Complications: None  EBL: <10 ML  Condition: stable  Operative Findings:  6cm laceration of right brow and upper and lower eyelid, extending to depth of frontal bone, no bony injury. Lateral canthus intact. No obvious injury to globe. No restrictions on forced duction testing.   Description of Operation: The patient was brought to the operating room on 08/23/2021 and placed in supine position on the operating table. General endotracheal anesthesia was established without difficulty. When the patient was adequately anesthetized, surgical timeout was performed and correct identification of the patient and the surgical procedure was done. The patient was then appropriately positioned and prepped and draped in sterile fashion.  The patient's wound was carefully examined and cleaned of foreign material using Betadine ophthalmic surgical prep.  The right eye was copiously irrigated using BSS and a corneal shield with lacrilube was placed over the globe to protect the eye. The surgical wound was then copiously irrigated using warm sterile saline. The periostium was approximated using 5-10 Vicryl. The soft tissue was examined and non viable tissue edges were debrided sharply using iris scissors. Bleeding was  controlled using bipolar cautery. Multiple complex lacerations were then closed in a layered fashion using 5-0 Vicryl, 6-0 plain gut, and 6-0 monocryl. Dermabond and steristrips were placed over the skin abrasions on the face. Bacitracin ophthalmic was placed on the incision lines of the upper and lower eyelid. The corneal shield was removed and forced duction testing performed to ensure normal eye movement. The eye was again copiously irrigated using BSS.   An orogastric tube was passed and stomach contents were aspirated. Patient was awakened from anesthetic and transferred from the operating room to the recovery room in stable condition.     Laren Boom, DO [facility]  08/23/2021
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
## 2021-08-23 NOTE — Discharge Instructions (Addendum)
Follow up with ophthalmology-  Tomorrow at 1:30PM or Friday morning 8:30AM 8 [address], [address]

## 2021-08-23 NOTE — Anesthesia Procedure Notes (Signed)
Procedure Name: Intubation Date/Time: 08/23/2021 12:28 PM Performed by: Nils Pyle, CRNA Pre-anesthesia Checklist: Patient identified, Emergency Drugs available, Suction available and Patient being monitored Patient Re-evaluated:Patient Re-evaluated prior to induction Oxygen Delivery Method: Circle System Utilized Preoxygenation: Pre-oxygenation with 100% oxygen Induction Type: IV induction, Rapid sequence and Cricoid Pressure applied Laryngoscope Size: Miller and 2 Grade View: Grade I Tube type: Oral Tube size: 7.0 mm Number of attempts: 1 Airway Equipment and Method: Stylet and Oral airway Placement Confirmation: ETT inserted through vocal cords under direct vision, positive ETCO2 and breath sounds checked- equal and bilateral Secured at: 22 cm Tube secured with: Tape Dental Injury: Teeth and Oropharynx as per pre-operative assessment

## 2021-08-24 ENCOUNTER — Encounter (HOSPITAL_COMMUNITY): Payer: Self-pay | Admitting: Otolaryngology

## 2021-08-25 NOTE — TOC CAGE-AID Note (Signed)
Transition of Care [facility]) - CAGE-AID Screening   Patient Details  Name: Cathy Sweeney MRN: [id number] Date of Birth: [birth year]  [facility] of [facility]) CM/SW Contact:    Kevontae Burgoon [name], LCSWA Phone Number: 08/25/2021, 1:21 PM   Clinical Narrative: Pt participated in Cage-Aid.  Pt stated she does use substance and ETOH socially.  Pt was offered resources, due to usage of substance and ETOH.  CSW will provide pt with resources for possible future use.  Cathy Sweeney, MSW, LCSW-A Pronouns:  She/Her/Hers [facility] Clinical Social Worker Direct Number:  [phone] [email or web address]   CAGE-AID Screening:    Have You Ever Felt You Ought to Cut Down on Your Drinking or Drug Use?: No Have People Annoyed You By [employer] Your Drinking Or Drug Use?: No Have You Felt Bad Or Guilty About Your Drinking Or Drug Use?: No Have You Ever Had a Drink or Used Drugs First Thing In The Morning to Steady Your Nerves or to Get Rid of a Hangover?: No CAGE-AID Score: 0  Substance Abuse Education Offered: Yes (Pt admitted to substance and ETOH use socially.)  Substance abuse interventions: Transport planner
